# Patient Record
Sex: Female | Born: 1965 | Race: Black or African American | Hispanic: No | Marital: Single | Smoking: Current every day smoker
Health system: Southern US, Community
[De-identification: ages and names within clinical notes are randomized; demographics above are authoritative.]

## PROBLEM LIST (undated history)

## (undated) DIAGNOSIS — J45909 Unspecified asthma, uncomplicated: Secondary | ICD-10-CM

## (undated) DIAGNOSIS — Z9109 Other allergy status, other than to drugs and biological substances: Secondary | ICD-10-CM

## (undated) DIAGNOSIS — K219 Gastro-esophageal reflux disease without esophagitis: Secondary | ICD-10-CM

## (undated) HISTORY — DX: Gastro-esophageal reflux disease without esophagitis: K21.9

---

## 2016-10-16 ENCOUNTER — Emergency Department (HOSPITAL_BASED_OUTPATIENT_CLINIC_OR_DEPARTMENT_OTHER)
Admission: EM | Admit: 2016-10-16 | Discharge: 2016-10-16 | Disposition: A | Discharge status: Home / Self Care | Payer: Commercial Managed Care - PPO | Attending: Emergency Medicine | Admitting: Emergency Medicine

## 2016-10-16 ENCOUNTER — Encounter (HOSPITAL_BASED_OUTPATIENT_CLINIC_OR_DEPARTMENT_OTHER): Payer: Self-pay | Admitting: *Deleted

## 2016-10-16 DIAGNOSIS — J45909 Unspecified asthma, uncomplicated: Secondary | ICD-10-CM | POA: Insufficient documentation

## 2016-10-16 DIAGNOSIS — L509 Urticaria, unspecified: Secondary | ICD-10-CM | POA: Diagnosis not present

## 2016-10-16 DIAGNOSIS — F17200 Nicotine dependence, unspecified, uncomplicated: Secondary | ICD-10-CM | POA: Diagnosis not present

## 2016-10-16 DIAGNOSIS — L299 Pruritus, unspecified: Secondary | ICD-10-CM | POA: Diagnosis present

## 2016-10-16 HISTORY — DX: Other allergy status, other than to drugs and biological substances: Z91.09

## 2016-10-16 MED ORDER — DIPHENHYDRAMINE HCL 25 MG PO CAPS
25.0000 mg | ORAL_CAPSULE | Freq: Once | ORAL | Status: AC
  Administered 2016-10-16: 25 mg via ORAL
  Filled 2016-10-16: qty 1

## 2016-10-16 MED ORDER — FAMOTIDINE 20 MG PO TABS
20.0000 mg | ORAL_TABLET | Freq: Once | ORAL | Status: AC
  Administered 2016-10-16: 20 mg via ORAL
  Filled 2016-10-16: qty 1

## 2016-10-16 MED ORDER — PREDNISONE 10 MG PO TABS
60.0000 mg | ORAL_TABLET | Freq: Once | ORAL | Status: AC
  Administered 2016-10-16: 60 mg via ORAL
  Filled 2016-10-16: qty 1

## 2016-10-16 MED ORDER — IPRATROPIUM BROMIDE 0.02 % IN SOLN
0.5000 mg | Freq: Once | RESPIRATORY_TRACT | Status: AC
  Administered 2016-10-16: 0.5 mg via RESPIRATORY_TRACT
  Filled 2016-10-16: qty 2.5

## 2016-10-16 MED ORDER — PREDNISONE 20 MG PO TABS: 40.0000 mg | ORAL_TABLET | Freq: Every day | ORAL | 0 refills | Status: DC

## 2016-10-16 MED ORDER — ALBUTEROL SULFATE (2.5 MG/3ML) 0.083% IN NEBU
5.0000 mg | INHALATION_SOLUTION | Freq: Once | RESPIRATORY_TRACT | Status: AC
  Administered 2016-10-16: 5 mg via RESPIRATORY_TRACT
  Filled 2016-10-16: qty 6

## 2016-10-16 MED FILL — predniSONE 20 MG TABS: 20 | 4 days supply | Qty: 8 | Fill #0

## 2016-10-16 NOTE — ED Triage Notes (Signed)
Pt reports rash "hives" and itching since Sunday, states she has allergic reaction several times yearly, states she sees an allergy doctor at times, but has not tried to get an appointment for this episode. Pt states had increased itching with mouth soreness this morning , so she came here for help.

## 2016-10-16 NOTE — Discharge Instructions (Signed)
Please continue taking over-the-counter Benadryl for itchiness. He may also continue taking your Zyrtec. Please follow-up with a primary care doctor and allergist if symptoms continue. Return to the ED if he developed any worsening symptoms or difficulty breathing or for any other reason. If you feel like you're having difficulty breathing or your throat is closing up please use her EpiPen.

## 2016-10-16 NOTE — ED Provider Notes (Signed)
[facility] Provider [facility]   CSN: [id number] Arrival date & time: 10/16/16  0841     History   Chief Complaint Chief Complaint  Patient presents with  . Allergic Reaction    HPI Heidi Washington is a 50 y.o. female.  50 year old African-American female past medical history significant for allergies presents to the ED today with allergic reaction. Patient states that she gets these reactions approximately 2-3 times per year. States that she has several allergies and the change in seasons can cause a reaction. She does not have a specific trigger. She is followed by an allergist but was able to make an appointment which is why she presented to the ER today. Patient states that she noticed hives that started yesterday and spread to her entire body. She endorses intense pruritus. She has not tried anything at home for the itching. Nothing makes better or worse. Patient states that she does have asthma and bronchitis and feels like she is wheezing slightly. Patient states that she does take Allegra daily for allergies. She states her allergies this year have been worse. She denies any shortness of breath or difficulty breathing. Denies feeling like her throat is closing up or facial/oral swelling. Denies any fever, chills, headache, vision changes, chest pain, shortness of breath, lightheadedness, dizziness, nausea, emesis, abdominal pain, urinary symptoms, change in bowel habits, numbness/tingling.      Past Medical History:  Diagnosis Date  . Environmental allergies     There are no active problems to display for this patient.   History reviewed. No pertinent surgical history.  OB History    No data available       Home Medications    Prior to Admission medications   Not on File    Family History History reviewed. No pertinent family history.  Social History Social History  Substance Use Topics  . Smoking status: Current Every Day Smoker  . Smokeless tobacco:  Never Used  . Alcohol use Not on file     Allergies   Review of patient's allergies indicates no known allergies.   Review of Systems Review of Systems  Constitutional: Negative for chills and fever.  HENT: Positive for congestion and sneezing. Negative for mouth sores.   Eyes: Positive for itching. Negative for visual disturbance.  Respiratory: Positive for wheezing. Negative for cough and shortness of breath.   Cardiovascular: Negative for chest pain, palpitations and leg swelling.  Gastrointestinal: Negative for abdominal pain, diarrhea, nausea and vomiting.  Skin: Positive for rash.  Allergic/Immunologic: Positive for environmental allergies.  Neurological: Negative for dizziness, syncope, weakness, light-headedness and headaches.  All other systems reviewed and are negative.    Physical Exam Updated Vital Signs BP 141/75 (BP Location: Left Arm)   Pulse 105   Temp 98.2 F (36.8 C) (Oral)   Resp 18   Ht 5\' 5"  (1.651 m)   Wt 83.5 kg   LMP 09/05/2016   SpO2 100%   BMI 30.62 kg/m   Physical Exam  Constitutional: She appears well-developed and well-nourished. No distress.  Patient is speaking in complete sentences and able to maintain her airway. She is able to maintain her secretions.  HENT:  Head: Normocephalic and atraumatic.  No angioedema of lips, tongue appreciated. Oropharynx is clear without any edema or erythema. No lesions noted in the oral cavity.  Eyes: Conjunctivae are normal. Pupils are equal, round, and reactive to light. Right eye exhibits no discharge. Left eye exhibits no discharge. No scleral icterus.  Neck:  Normal range of motion. Neck supple.  Cardiovascular: Normal rate, regular rhythm, normal heart sounds and intact distal pulses.   Heart rate was 92 on my exam.  Pulmonary/Chest: Effort normal and breath sounds normal. No respiratory distress. She has no wheezes.  She is speaking in complete sentences and maintaining her airway. Oxygen saturation  are 100% on room air. Lungs are clear to auscultation bilaterally.  Musculoskeletal: Normal range of motion.  Lymphadenopathy:    She has no cervical adenopathy.  Neurological: She is alert.  Skin: Skin is warm and dry. Rash noted. No pallor.  Urticarial hives diffusely to upper chest, back, bilateral arms. Slight erythema to the face. No edema noted.  Nursing note and vitals reviewed.    ED Treatments / Results  Labs (all labs ordered are listed, but only abnormal results are displayed) Labs Reviewed - No data to display  EKG  EKG Interpretation None       Radiology No results found.  Procedures Procedures (including critical care time)  Medications Ordered in ED Medications  predniSONE (DELTASONE) tablet 60 mg (not administered)  famotidine (PEPCID) tablet 20 mg (not administered)  diphenhydrAMINE (BENADRYL) capsule 25 mg (not administered)  albuterol (PROVENTIL) (2.5 MG/3ML) 0.083% nebulizer solution 5 mg (not administered)  ipratropium (ATROVENT) nebulizer solution 0.5 mg (not administered)     Initial Impression / Assessment and Plan / ED Course  I have reviewed the triage vital signs and the nursing notes.  Pertinent labs & imaging results that were available during my care of the patient were reviewed by me and considered in my medical decision making (see chart for details).  Clinical Course  Patient re-evaluated prior to dc, is hemodynamically stable, in no respiratory distress, and denies the feeling of throat closing. Pt has been advised to take OTC benadryl and prednisone for 4 days & return to the ED if they have a mod-severe allergic rxn (s/s including throat closing, difficulty breathing, swelling of lips face or tongue). Pt is to follow up with their PCP. Pt is agreeable with plan & verbalizes understanding. She has been encouraged to continue using her zyrtec. She feels like she is safe for discharge. Discharge home in NAD with stable vs with strict  return precautions.   Final Clinical Impressions(s) / ED Diagnoses   Final diagnoses:  Urticaria    New Prescriptions New Prescriptions   PREDNISONE (DELTASONE) 20 MG TABLET    Take 2 tablets (40 mg total) by mouth daily with breakfast.     Rise MuKenneth T Mishell Donalson, PA-C 10/16/16 1019    Alvira MondayErin Schlossman, MD 10/17/16 2335

## 2016-10-25 ENCOUNTER — Emergency Department (HOSPITAL_BASED_OUTPATIENT_CLINIC_OR_DEPARTMENT_OTHER): Payer: Commercial Managed Care - PPO

## 2016-10-25 ENCOUNTER — Emergency Department (HOSPITAL_BASED_OUTPATIENT_CLINIC_OR_DEPARTMENT_OTHER)
Admission: EM | Admit: 2016-10-25 | Discharge: 2016-10-25 | Disposition: A | Discharge status: Home / Self Care | Payer: Commercial Managed Care - PPO | Attending: Emergency Medicine | Admitting: Emergency Medicine

## 2016-10-25 ENCOUNTER — Encounter (HOSPITAL_BASED_OUTPATIENT_CLINIC_OR_DEPARTMENT_OTHER): Payer: Self-pay | Admitting: *Deleted

## 2016-10-25 DIAGNOSIS — L299 Pruritus, unspecified: Secondary | ICD-10-CM

## 2016-10-25 DIAGNOSIS — Z79899 Other long term (current) drug therapy: Secondary | ICD-10-CM | POA: Insufficient documentation

## 2016-10-25 DIAGNOSIS — L509 Urticaria, unspecified: Secondary | ICD-10-CM | POA: Diagnosis not present

## 2016-10-25 DIAGNOSIS — F17210 Nicotine dependence, cigarettes, uncomplicated: Secondary | ICD-10-CM | POA: Diagnosis not present

## 2016-10-25 LAB — CBC WITH DIFFERENTIAL/PLATELET
BASOS PCT: 0 %
Basophils Absolute: 0 10*3/uL (ref 0.0–0.1)
EOS ABS: 0 10*3/uL (ref 0.0–0.7)
EOS PCT: 0 %
HCT: 41.8 % (ref 36.0–46.0)
Hemoglobin: 13.7 g/dL (ref 12.0–15.0)
Lymphocytes Relative: 24 %
Lymphs Abs: 1.5 10*3/uL (ref 0.7–4.0)
MCH: 30 pg (ref 26.0–34.0)
MCHC: 32.8 g/dL (ref 30.0–36.0)
MCV: 91.7 fL (ref 78.0–100.0)
MONO ABS: 0.2 10*3/uL (ref 0.1–1.0)
MONOS PCT: 3 %
NEUTROS PCT: 73 %
Neutro Abs: 4.7 10*3/uL (ref 1.7–7.7)
PLATELETS: 292 10*3/uL (ref 150–400)
RBC: 4.56 MIL/uL (ref 3.87–5.11)
RDW: 13.4 % (ref 11.5–15.5)
WBC: 6.4 10*3/uL (ref 4.0–10.5)

## 2016-10-25 LAB — COMPREHENSIVE METABOLIC PANEL
ALBUMIN: 4.3 g/dL (ref 3.5–5.0)
ALT: 18 U/L (ref 14–54)
ANION GAP: 7 (ref 5–15)
AST: 16 U/L (ref 15–41)
Alkaline Phosphatase: 76 U/L (ref 38–126)
BUN: 10 mg/dL (ref 6–20)
CO2: 25 mmol/L (ref 22–32)
Calcium: 9.8 mg/dL (ref 8.9–10.3)
Chloride: 106 mmol/L (ref 101–111)
Creatinine, Ser: 0.49 mg/dL (ref 0.44–1.00)
GFR calc Af Amer: >60 mL/min (ref >60)
GFR calc non Af Amer: >60 mL/min (ref >60)
GLUCOSE: 76 mg/dL (ref 65–99)
POTASSIUM: 3.6 mmol/L (ref 3.5–5.1)
SODIUM: 138 mmol/L (ref 135–145)
Total Bilirubin: 0.5 mg/dL (ref 0.3–1.2)
Total Protein: 7.4 g/dL (ref 6.5–8.1)

## 2016-10-25 MED ORDER — FAMOTIDINE IN NACL 20-0.9 MG/50ML-% IV SOLN
20.0000 mg | Freq: Once | INTRAVENOUS | Status: AC
  Administered 2016-10-25: 20 mg via INTRAVENOUS
  Filled 2016-10-25: qty 50

## 2016-10-25 MED ORDER — DIPHENHYDRAMINE HCL 50 MG/ML IJ SOLN
50.0000 mg | Freq: Once | INTRAMUSCULAR | Status: AC
  Administered 2016-10-25: 50 mg via INTRAVENOUS
  Filled 2016-10-25: qty 1

## 2016-10-25 MED ORDER — METHYLPREDNISOLONE SODIUM SUCC 125 MG IJ SOLR
125.0000 mg | Freq: Once | INTRAMUSCULAR | Status: AC
  Administered 2016-10-25: 125 mg via INTRAVENOUS
  Filled 2016-10-25: qty 2

## 2016-10-25 MED ORDER — SODIUM CHLORIDE 0.9 % IV BOLUS (SEPSIS)
1000.0000 mL | Freq: Once | INTRAVENOUS | Status: AC
  Administered 2016-10-25: 1000 mL via INTRAVENOUS

## 2016-10-25 MED ORDER — PREDNISONE 50 MG PO TABS: 50.0000 mg | ORAL_TABLET | Freq: Every day | ORAL | 0 refills | Status: AC

## 2016-10-25 MED FILL — predniSONE 50 MG TABS: 50 | 5 days supply | Qty: 5 | Fill #0

## 2016-10-25 NOTE — ED Notes (Signed)
Pt directed to pharmacy to pick up prescriptions-  

## 2016-10-25 NOTE — ED Provider Notes (Signed)
[facility]   CSN: [id number] Arrival date & time: 10/25/16  7829     History   Chief Complaint Chief Complaint  Patient presents with  . Urticaria    HPI Heidi Washington is a 50 y.o. female With a past medical history significant for allergies who presents with rash, pruritus, decrease in appetite, and mild cough. Patient is accompanied by spouse. Patient reports that four years, she has had episodes of allergic reactions. She reports that last week, she had an episode of diffuse rash and pruritus. Patient reports that she is "allergic to everything" and  This is a typical episode of allergic reaction for her. She denies any difficulty breathing, throat swelling, difficulty swallowing, or tongue swelling. She does report mild lip tingling/swelling. She says that she completed steroids several days ago and yesterday her rash return. She denies nausea, vomiting, lightheadedness, chest pain, shortness of breath, constipation, diarrhea, dysuria. She does however report a mild cough.   Rash   This is a recurrent problem. The current episode started yesterday. The problem has not changed since onset.The problem is associated with nothing. There has been no fever. Affected Location: all over body. The pain is at a severity of 0/10. The patient is experiencing no pain. The pain has been constant since onset. Associated symptoms include itching. Pertinent negatives include no blisters, no pain and no weeping. She has tried antihistamines for the symptoms. The treatment provided no relief. Risk factors include a change in medications (patient completed steroids several days ago.).    Past Medical History:  Diagnosis Date  . Environmental allergies     There are no active problems to display for this patient.   History reviewed. No pertinent surgical history.  OB History    No data available       Home Medications    Prior to Admission medications   Medication  Sig Start Date End Date Taking? Authorizing Provider  diphenhydrAMINE (BENADRYL) 25 mg capsule Take 25 mg by mouth every 6 (six) hours as needed.   Yes Historical Provider, MD  famotidine (PEPCID) 10 MG tablet Take 10 mg by mouth daily.   Yes Historical Provider, MD  fexofenadine (ALLEGRA) 180 MG tablet Take 180 mg by mouth daily.   Yes Historical Provider, MD  loratadine (CLARITIN) 10 MG tablet Take 10 mg by mouth daily.   Yes Historical Provider, MD  predniSONE (DELTASONE) 20 MG tablet Take 2 tablets (40 mg total) by mouth daily with breakfast. 10/16/16   Rise Mu, PA-C    Family History No family history on file.  Social History Social History  Substance Use Topics  . Smoking status: Current Every Day Smoker    Types: Cigarettes  . Smokeless tobacco: Never Used  . Alcohol use 1.2 oz/week    2 Cans of beer per week     Comment: 2beers/daily     Allergies   Review of patient's allergies indicates no known allergies.   Review of Systems Review of Systems  Constitutional: Positive for appetite change (Decreased). Negative for activity change, chills, diaphoresis, fatigue and fever.  HENT: Positive for facial swelling (Patient reports mild lip swelling). Negative for congestion, postnasal drip, rhinorrhea, sinus pressure, sore throat, trouble swallowing and voice change.   Eyes: Negative for visual disturbance.  Respiratory: Negative for cough, chest tightness, shortness of breath and stridor.   Cardiovascular: Negative for chest pain, palpitations and leg swelling.  Gastrointestinal: Negative for abdominal distention, abdominal pain, constipation, diarrhea,  nausea and vomiting.  Genitourinary: Negative for difficulty urinating, dysuria, flank pain, frequency, hematuria, menstrual problem, pelvic pain, vaginal bleeding and vaginal discharge.  Musculoskeletal: Negative for back pain and neck pain.  Skin: Positive for itching and rash. Negative for wound.  Neurological:  Negative for dizziness, weakness, light-headedness, numbness and headaches.  Psychiatric/Behavioral: Negative for agitation and confusion.  All other systems reviewed and are negative.    Physical Exam Updated Vital Signs BP 141/90 (BP Location: Right Arm)   Pulse 103   Temp 99.1 F (37.3 C) (Oral)   Resp 18   Ht 5\' 5"  (1.651 m)   Wt 184 lb (83.5 kg)   LMP 09/05/2016   SpO2 100%   BMI 30.62 kg/m   Physical Exam  Constitutional: She is oriented to person, place, and time. She appears well-developed and well-nourished. No distress.  HENT:  Head: Normocephalic and atraumatic.  Mouth/Throat: No oropharyngeal exudate.  Eyes: Conjunctivae and EOM are normal. Pupils are equal, round, and reactive to light.  Neck: Normal range of motion. Neck supple.  Cardiovascular: Normal rate and regular rhythm.   No murmur heard. Pulmonary/Chest: Effort normal and breath sounds normal. No stridor. No respiratory distress. She has no wheezes. She has no rales. She exhibits no tenderness.  Abdominal: Soft. There is no tenderness.  Musculoskeletal: She exhibits no edema.  Neurological: She is alert and oriented to person, place, and time. She has normal reflexes. She displays normal reflexes. No cranial nerve deficit. She exhibits normal muscle tone. Coordination normal.  Skin: Skin is warm and dry. Capillary refill takes less than 2 seconds. Rash noted.  Psychiatric: She has a normal mood and affect.  Nursing note and vitals reviewed.    ED Treatments / Results  Labs (all labs ordered are listed, but only abnormal results are displayed) Labs Reviewed  CBC WITH DIFFERENTIAL/PLATELET  COMPREHENSIVE METABOLIC PANEL    EKG  EKG Interpretation None       Radiology Dg Chest 2 View  Result Date: 10/25/2016 CLINICAL DATA:  Persistent hives. EXAM: CHEST  2 VIEW COMPARISON:  None. FINDINGS: The cardiac silhouette, mediastinal and hilar contours are normal and stable. The lungs are clear. No  pleural effusions. The bony thorax is intact. IMPRESSION: No acute cardiopulmonary findings. Electronically Signed   By: Rudie MeyerP.  Gallerani M.D.   On: 10/25/2016 10:54    Procedures Procedures (including critical care time)  Medications Ordered in ED Medications  methylPREDNISolone sodium succinate (SOLU-MEDROL) 125 mg/2 mL injection 125 mg (125 mg Intravenous Given 10/25/16 1048)  diphenhydrAMINE (BENADRYL) injection 50 mg (50 mg Intravenous Given 10/25/16 1050)  famotidine (PEPCID) IVPB 20 mg premix (0 mg Intravenous Stopped 10/25/16 1120)  sodium chloride 0.9 % bolus 1,000 mL (0 mLs Intravenous Stopped 10/25/16 1224)     Initial Impression / Assessment and Plan / ED Course  I have reviewed the triage vital signs and the nursing notes.  Pertinent labs & imaging results that were available during my care of the patient were reviewed by me and considered in my medical decision making (see chart for details).  Clinical Course  Value Comment By Time  Hemoglobin: 13.7 (Reviewed) Canary Brimhristopher J Tegeler, MD 11/03 2048    Staci RighterMary Paternostro is a 50 y.o. female With a past medical history significant for allergies who presents with rash, pruritus, decrease in appetite, and mild cough.   History and exam are seen above.  Patient exam showed urticaria rash all over body. No posterior oropharyngeal edema or swelling was seen.  No lip swelling or tongue swelling was seen. Normal neck range of motion with no stridor. No wheezing. No abdominal tenderness. Unremarkable neurologic exam. Lungs clear.  Given cough, x-ray order. No evidence of pneumonia.  Patient given Fluids, histamine blockers, and steroids. Patient had resolution in itching. Patient had significant improvement in rash.   Laboratory testing return showing no acute abnormalities. Given patient's resolution in itching, and improvement in rash, patient felt appropriate for discharge. Patient instructed to follow up with PCP in several days to discuss  more long-term allergy management. Patient will be given several days of steroids. Patient will continue outpatient antihistamine management. Do not feel patient needs epinephrine at this time due to lack of airway problems. Patient understood return precautions and was discharged in good condition.     Final Clinical Impressions(s) / ED Diagnoses   Final diagnoses:  Urticaria  Pruritus    New Prescriptions Discharge Medication List as of 10/25/2016  1:13 PM    START taking these medications   Details  !! predniSONE (DELTASONE) 50 MG tablet Take 1 tablet (50 mg total) by mouth daily., Starting Fri 10/25/2016, Until Wed 10/30/2016, Print     !! - Potential duplicate medications found. Please discuss with provider.      Clinical Impression: 1. Urticaria   2. Pruritus     Disposition: Discharge  Condition: Good  I have discussed the results, Dx and Tx plan with the pt(& family if present). He/she/they expressed understanding and agree(s) with the plan. Discharge instructions discussed at great length. Strict return precautions discussed and pt &/or family have verbalized understanding of the instructions. No further questions at time of discharge.    Discharge Medication List as of 10/25/2016  1:13 PM    START taking these medications   Details  !! predniSONE (DELTASONE) 50 MG tablet Take 1 tablet (50 mg total) by mouth daily., Starting Fri 10/25/2016, Until Wed 10/30/2016, Print     !! - Potential duplicate medications found. Please discuss with provider.      Follow Up: Glorianne ManchesterMakeecha Reed-Crawford, MD [address] [place] [address]        Heide Scaleshristopher J Tegeler, MD 10/25/16 [phone]

## 2016-10-25 NOTE — Discharge Instructions (Signed)
Please follow-up with your PCP in the next few days for further management of your allergic reactions. Please take Benadryl and Pepcid which she had at home. Please take the steroids for the next few days. Please return if any symptoms return or worsen.

## 2016-10-25 NOTE — ED Triage Notes (Signed)
Seen here on 10/25 and treated with prednisone for Hives. States finished prednisone Sunday this week and hives returned on Tuesday. States Sx are worsening. Speech clear, no s/s respiratory distress

## 2016-11-11 ENCOUNTER — Ambulatory Visit (INDEPENDENT_AMBULATORY_CARE_PROVIDER_SITE_OTHER): Payer: Commercial Managed Care - PPO | Admitting: Pediatrics

## 2016-11-11 ENCOUNTER — Encounter: Payer: Self-pay | Admitting: Pediatrics

## 2016-11-11 VITALS — BP 130/80 | HR 88 | Temp 98.2°F | Resp 20 | Ht 64.0 in | Wt 185.6 lb

## 2016-11-11 DIAGNOSIS — E049 Nontoxic goiter, unspecified: Secondary | ICD-10-CM | POA: Diagnosis not present

## 2016-11-11 DIAGNOSIS — L508 Other urticaria: Secondary | ICD-10-CM | POA: Diagnosis not present

## 2016-11-11 DIAGNOSIS — R5383 Other fatigue: Secondary | ICD-10-CM

## 2016-11-11 DIAGNOSIS — J4530 Mild persistent asthma, uncomplicated: Secondary | ICD-10-CM

## 2016-11-11 DIAGNOSIS — Z720 Tobacco use: Secondary | ICD-10-CM | POA: Diagnosis not present

## 2016-11-11 DIAGNOSIS — J3089 Other allergic rhinitis: Secondary | ICD-10-CM | POA: Diagnosis not present

## 2016-11-11 DIAGNOSIS — T7800XD Anaphylactic reaction due to unspecified food, subsequent encounter: Secondary | ICD-10-CM | POA: Diagnosis not present

## 2016-11-11 DIAGNOSIS — T7800XA Anaphylactic reaction due to unspecified food, initial encounter: Secondary | ICD-10-CM | POA: Insufficient documentation

## 2016-11-11 MED ORDER — RANITIDINE HCL 150 MG PO TABS: 150.0000 mg | ORAL_TABLET | Freq: Two times a day (BID) | ORAL | 5 refills | Status: AC

## 2016-11-11 MED ORDER — EPINEPHRINE 0.3 MG/0.3ML IJ SOAJ: INTRAMUSCULAR | 2 refills | Status: AC

## 2016-11-11 MED ORDER — ALBUTEROL SULFATE HFA 108 (90 BASE) MCG/ACT IN AERS: 2.0000 | INHALATION_SPRAY | RESPIRATORY_TRACT | 2 refills | Status: AC | PRN

## 2016-11-11 MED ORDER — MONTELUKAST SODIUM 10 MG PO TABS: 10.0000 mg | ORAL_TABLET | Freq: Every day | ORAL | 5 refills | Status: AC

## 2016-11-11 NOTE — Progress Notes (Signed)
[address] [place] [address] Dept: [phone]  New Patient Note  Patient ID: Heidi RighterMary Washington, female    DOB: 1966-10-22  Age: 50 y.o. MRN: [id number] Date of Office Visit: 11/11/2016 Referring provider: Glorianne ManchesterMakeecha Reed-Crawford, MD [address] [place], [place] [address]    Chief Complaint: Urticaria (urgent care twice in one month, hives all over. Just finished prednisone pack. )  HPI Heidi RighterMary Washington presents for evaluation of exacerbation of chronic urticaria. Her exacerbation began about a month ago and she needed 5 days of prednisone starting October 25th. Her asthma has been worse since March of this year. She has been smoking cigarettes since 50 years of age. She has had asthma for several years. She has nasal congestion aggravated by exposure to dust, cigarette smoke and weather changes. She is allergic to fish and shellfish. Her allergic reactions to fish and shellfish were noted in 2014. She has had urticaria since 2009. She has a history of a goiter with normal thyroid testing the past. She is on fexofenadine 180 milligrams at night and  loratadine 10 mg at night and Pepcid 10 mg once a day  Review of Systems  Constitutional: Negative.   HENT:       Nasal congestion for several years  Eyes: Negative.   Respiratory:       Asthma for several years. Current cigarette smoker  Cardiovascular: Negative.   Gastrointestinal:       Heartburn and stomach acid worse over the past month  Genitourinary: Negative.   Musculoskeletal: Negative.   Skin:       Hives since 2009  Neurological: Negative.   Endo/Heme/Allergies:       No diabetes. Goiter in the past with normal thyroid studies  Psychiatric/Behavioral: Negative.     Outpatient Encounter Prescriptions as of 11/11/2016  Medication Sig  . diphenhydrAMINE (BENADRYL) 25 mg capsule Take 25 mg by mouth every 6 (six) hours as needed.  . famotidine (PEPCID) 10 MG tablet Take 10 mg by mouth daily.  . fexofenadine (ALLEGRA)  180 MG tablet Take 180 mg by mouth daily.  Marland Kitchen. loratadine (CLARITIN) 10 MG tablet Take 10 mg by mouth daily.  . predniSONE (DELTASONE) 20 MG tablet Take 2 tablets (40 mg total) by mouth daily with breakfast. (Patient not taking: Reported on 11/11/2016)   No facility-administered encounter medications on file as of 11/11/2016.      Drug Allergies:  No Known Allergies  Family History: Heidi Washington's family history includes Allergic rhinitis in her brother, daughter, mother, and sister; Asthma in her brother and mother; Bronchitis in her mother; COPD in her mother; Urticaria in her son..  Social and environmental. 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 There are no pets in the home. She has been smoking cigarettes for 23 years averaging 10-15 cigarettes per day  Physical Exam: BP 130/80   Pulse 88   Temp 98.2 F (36.8 C) (Oral)   Resp 20   Ht 5\' 4"  (1.626 m)   Wt 185 lb 9.6 oz (84.2 kg)   LMP 09/05/2016   BMI 31.86 kg/m    Physical Exam  Constitutional: She is oriented to person, place, and time. She appears well-developed and well-nourished.  HENT:  Eyes normal. Ears normal. Nose moderate swelling of nasal turbinates. Pharynx normal.  Neck: Neck supple. Thyromegaly (her thyroid was symmetrically enlarged and soft) present.  Cardiovascular:  S1 and S2 normal no murmurs  Pulmonary/Chest:  Clear to percussionand auscultation  Abdominal: Soft. There is no  tenderness (no hepatosplenomegaly).  Lymphadenopathy:    She has no cervical adenopathy.  Neurological: She is alert and oriented to person, place, and time.  Skin:  Clear except for several hives present. There was a suggestion of dermographia  Psychiatric: She has a normal mood and affect. Her behavior is normal. Judgment and thought content normal.  Vitals reviewed.   Diagnostics: FVC 1.99 L FEV1 1.74 L. Predicted FVC 2.90 L predicted FEV1 2.32 L. After albuterol 2 puffs FVC 1.98 L FEV1 1.70 L-this shows a mild reduction in  the forced vital  capacity and FEV1 was no significant improvement after albuterol  Allergy skin tests were positive to grass pollen, dust mites, cockroach. Slight reactivity noted to weeds, cat and  dog. She had very positive skin tests to a shellfish and fish   Assessment  Assessment and Plan: 1. Chronic urticaria   2. Fatigue, unspecified type   3. Mild persistent asthma without complication   4. 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 Goiter   7. Tobacco use     No orders of the defined types were placed in this encounter.   Patient Instructions  Allegra 180 mg in the morning and Zyrtec 10 mg at night for itching Ranitidine 150 mg twice a day-it may help hives Montelukast  10 mg once a day for coughing or wheezing-it may help hives Ventolin 2 puffs every 4 hours if needed for wheezing or coughing spells Add prednisone 10 mg twice a day for 4 days, 10 mg on the fifth day to bring your allergic symptoms under control Do foods with salicylates make you itch? Stop smoking cigarettes  Avoid fish and shellfish. If you have an allergic reaction take Benadryl 50 mg every 4 hours and if you have life-threatening symptoms inject with EpiPen 0.3 mg  I will call you with the results of your  lab work   Return in about 4 weeks (around 12/09/2016).   Thank you for the opportunity to care for this patient.  Please do not hesitate to contact me with questions.  Tonette BihariJ. A. Ewan Grau, M.D.  [facility] [address] [place], [place] [address] [phone]

## 2016-11-11 NOTE — Patient Instructions (Addendum)
Allegra 180 mg in the morning and Zyrtec 10 mg at night for itching Ranitidine 150 mg twice a day-it may help hives Montelukast  10 mg once a day for coughing or wheezing-it may help hives Ventolin 2 puffs every 4 hours if needed for wheezing or coughing spells Add prednisone 10 mg twice a day for 4 days, 10 mg on the fifth day to bring your allergic symptoms under control Do foods with salicylates make you itch? Stop smoking cigarettes  Avoid fish and shellfish. If you have an allergic reaction take Benadryl 50 mg every 4 hours and if you have life-threatening symptoms inject with EpiPen 0.3 mg  I will call you with the results of your  lab work

## 2016-11-22 LAB — CBC WITH DIFFERENTIAL/PLATELET
BASOS ABS: 0 10*3/uL (ref 0.0–0.2)
Basos: 0 %
EOS (ABSOLUTE): 0 10*3/uL (ref 0.0–0.4)
Eos: 1 %
Hematocrit: 37.8 % (ref 34.0–46.6)
Hemoglobin: 11.9 g/dL (ref 11.1–15.9)
Immature Grans (Abs): 0 10*3/uL (ref 0.0–0.1)
Immature Granulocytes: 0 %
Lymphocytes Absolute: 1.7 10*3/uL (ref 0.7–3.1)
Lymphs: 32 %
MCH: 29.2 pg (ref 26.6–33.0)
MCHC: 31.5 g/dL (ref 31.5–35.7)
MCV: 93 fL (ref 79–97)
Monocytes Absolute: 0.2 10*3/uL (ref 0.1–0.9)
Monocytes: 3 %
NEUTROS ABS: 3.4 10*3/uL (ref 1.4–7.0)
Neutrophils: 64 %
PLATELETS: 300 10*3/uL (ref 150–379)
RBC: 4.07 x10E6/uL (ref 3.77–5.28)
RDW: 13.7 % (ref 12.3–15.4)
WBC: 5.2 10*3/uL (ref 3.4–10.8)

## 2016-11-22 LAB — COMPREHENSIVE METABOLIC PANEL
ALK PHOS: 87 IU/L (ref 39–117)
ALT: 17 IU/L (ref 0–32)
AST: 14 IU/L (ref 0–40)
Albumin/Globulin Ratio: 1.6 (ref 1.2–2.2)
Albumin: 4.3 g/dL (ref 3.5–5.5)
BILIRUBIN TOTAL: 0.3 mg/dL (ref 0.0–1.2)
BUN/Creatinine Ratio: 18 (ref 9–23)
BUN: 11 mg/dL (ref 6–24)
CHLORIDE: 103 mmol/L (ref 96–106)
CO2: 23 mmol/L (ref 18–29)
Calcium: 9.9 mg/dL (ref 8.7–10.2)
Creatinine, Ser: 0.6 mg/dL (ref 0.57–1.00)
GFR calc Af Amer: 122 mL/min/{1.73_m2} (ref >59)
GFR calc non Af Amer: 106 mL/min/{1.73_m2} (ref >59)
GLUCOSE: 106 mg/dL — AB (ref 65–99)
Globulin, Total: 2.7 g/dL (ref 1.5–4.5)
Potassium: 3.9 mmol/L (ref 3.5–5.2)
Sodium: 143 mmol/L (ref 134–144)
Total Protein: 7 g/dL (ref 6.0–8.5)

## 2016-11-22 LAB — TSH+FREE T4
FREE T4: 0.93 ng/dL (ref 0.82–1.77)
TSH: 1.64 u[IU]/mL (ref 0.450–4.500)

## 2016-11-22 LAB — SEDIMENTATION RATE: SED RATE: 25 mm/h (ref 0–40)

## 2016-11-22 LAB — ANA W/REFLEX IF POSITIVE: ANA: NEGATIVE

## 2016-11-22 LAB — TRYPTASE: Tryptase: 3 ug/L (ref 2.2–13.2)

## 2016-11-22 LAB — CHRONIC URTICARIA

## 2016-11-26 ENCOUNTER — Telehealth: Payer: Self-pay | Admitting: Allergy

## 2016-11-26 NOTE — Telephone Encounter (Signed)
Patient called said she was seen on 11-11-16 for hives. Was put on prednisone. Didn't start until  Monday nov 27th. Finished on Dec.1.  On Sat. And Sunday started  breaking out again all over body getting on face lips and eyes. Please advise. Work # [phone] until three o'clock after than cell [phone].

## 2016-11-26 NOTE — Telephone Encounter (Signed)
Informed patient of Dr. Beaulah DinningBardelas note. Patient will pick up information tomorrow. And schedule appointment for xolair.

## 2016-11-26 NOTE — Telephone Encounter (Signed)
Continue on Allegra 180 mg in the morning and Zyrtec 10 mg at night, ranitidine 150 mg twice a day, montelukast a10 mg once a day Did  foods with salicylates make her itch. Has she been given information on Xolair ? If not let's go ahead and give her information. See if we have a free sample of Xolair to administer

## 2016-11-27 ENCOUNTER — Ambulatory Visit (INDEPENDENT_AMBULATORY_CARE_PROVIDER_SITE_OTHER): Payer: Commercial Managed Care - PPO | Admitting: *Deleted

## 2016-11-27 DIAGNOSIS — L500 Allergic urticaria: Secondary | ICD-10-CM

## 2016-11-27 NOTE — Progress Notes (Signed)
Immunotherapy   Patient Details  Name: Staci RighterMary Harwick MRN: [id number] Date of Birth: March 29, 1966  11/27/2016  Staci RighterMary Helf started injections for Xolair 300 mg for urticaria  Following schedule: Every 4 weeks Epi-Pen:Epi-Pen Available  Consent signed and patient instructions given. Sample of Xolair 300 mg given today per Dr. Beaulah DinningBardelas. Patient waited 1 hour with no problems.    Maurine SimmeringLogan D Adamae Ricklefs 11/27/2016, 4:34 PM

## 2016-11-28 DIAGNOSIS — L500 Allergic urticaria: Secondary | ICD-10-CM

## 2016-11-28 MED ORDER — OMALIZUMAB 150 MG ~~LOC~~ SOLR
300.0000 mg | SUBCUTANEOUS | Status: AC
  Administered 2016-11-28 – 2017-02-24 (×4): 300 mg via SUBCUTANEOUS

## 2016-12-30 ENCOUNTER — Encounter: Payer: Self-pay | Admitting: Pediatrics

## 2016-12-30 ENCOUNTER — Ambulatory Visit (INDEPENDENT_AMBULATORY_CARE_PROVIDER_SITE_OTHER): Payer: Commercial Managed Care - PPO | Admitting: Pediatrics

## 2016-12-30 VITALS — BP 122/78 | HR 88 | Temp 99.4°F | Resp 16

## 2016-12-30 DIAGNOSIS — J4530 Mild persistent asthma, uncomplicated: Secondary | ICD-10-CM | POA: Diagnosis not present

## 2016-12-30 DIAGNOSIS — Z720 Tobacco use: Secondary | ICD-10-CM | POA: Diagnosis not present

## 2016-12-30 DIAGNOSIS — L508 Other urticaria: Secondary | ICD-10-CM

## 2016-12-30 DIAGNOSIS — L500 Allergic urticaria: Secondary | ICD-10-CM

## 2016-12-30 NOTE — Patient Instructions (Addendum)
Continue on her current medications Add Zithromax 250 mg-take 2 tablets tonight and then one tablet once at night for the next 4 nights see if we can clear up the discolored mucus in her chest and improve her asthma Add prednisone 10 mg twice a day for 4 days, 10 mg on the fifth day to bring her allergic symptoms under control Stop smoking cigarettes Call me if you are not doing well on this treatment plan Continue on Xolair for chronic urticaria

## 2016-12-30 NOTE — Progress Notes (Signed)
  [address] [place] [address] Dept: [phone]  FOLLOW UP NOTE  Patient ID: Heidi Washington, female    DOB: Sep 25, 1966  Age: 51 y.o. MRN: [id number] Date of Office Visit: 12/30/2016  Assessment  Chief Complaint: Urticaria and Asthma  HPI Heidi Washington presents for follow-up of chronic urticaria and asthma. She did improve with her initial injection of Xolair with regard to her chronic urticaria. Over the past month she has noticed some coughing and mild wheezing. She is bringing up a discolored mucus from her chest. She continues to smoke cigarettes. Foods with salicylates did not make her itch. She continues to avoid fish and shellfish. If she has an allergic reaction , she takes Benadryl 50 mg every 4 hours and  if she has life and symptoms she  injects with EpiPen 0.3 mg . As part of her evaluation for chronic urticaria, she had normal thyroid function, and negative chronic urticaria index, negative serum ANCA, normal CBC with differential, normal complete metabolic panel, sedimentation rate of 25 mm/h and a negative tryptase  Current medications are Allegra 180 mg in the morning and Zyrtec 10 mg at night, ranitidine 150 mg twice a day, montelukast asked 10 mg once a day, Ventolin 2 puffs every 4 hours if needed for wheezing or coughing spells.     Drug Allergies:  No Known Allergies  Physical Exam: BP 122/78   Pulse 88   Temp 99.4 F (37.4 C) (Oral)   Resp 16   SpO2 95%    Physical Exam  Constitutional: She is oriented to person, place, and time. She appears well-developed and well-nourished.  HENT:  Eyes normal. Ears normal. Nose normal. Pharynx normal.  Neck: Neck supple.  Cardiovascular:  S1 and S2 normal no murmurs  Pulmonary/Chest:  Clear to percussion auscultation except for minimal wheezing on end expiration  Lymphadenopathy:    She has no cervical adenopathy.  Neurological: She is alert and oriented to person, place, and time.  Skin:  Clear  Psychiatric:  She has a normal mood and affect. Her behavior is normal. Judgment and thought content normal.  Vitals reviewed.   Diagnostics:  FVC 1.91 L FEV1 1.71 L. Predicted FVC 2.90 L predicted FEV1 2.32 L-this shows a mild reduction in the forced vital capacity but stable for her  Assessment and Plan: 1. Allergic urticaria   2. Chronic urticaria   3. Mild persistent asthma without complication   4. Tobacco use     No orders of the defined types were placed in this encounter.   Patient Instructions  Continue on her current medications Add Zithromax 250 mg-take 2 tablets tonight and then one tablet once at night for the next 4 nights see if we can clear up the discolored mucus in her chest and improve her asthma Add prednisone 10 mg twice a day for 4 days, 10 mg on the fifth day to bring her allergic symptoms under control Stop smoking cigarettes Call me if you are not doing well on this treatment plan   Return in about 3 months (around 03/30/2017).    Thank you for the opportunity to care for this patient.  Please do not hesitate to contact me with questions.  Tonette BihariJ. A. Angalina Ante, M.D.  [facility] [address] [place], [place] [address] [phone]

## 2016-12-31 MED ORDER — AZITHROMYCIN 250 MG PO TABS: ORAL_TABLET | ORAL | 0 refills | Status: AC

## 2017-01-27 ENCOUNTER — Ambulatory Visit (INDEPENDENT_AMBULATORY_CARE_PROVIDER_SITE_OTHER): Payer: Commercial Managed Care - PPO

## 2017-01-27 DIAGNOSIS — L500 Allergic urticaria: Secondary | ICD-10-CM

## 2017-01-27 DIAGNOSIS — L508 Other urticaria: Secondary | ICD-10-CM

## 2017-02-11 ENCOUNTER — Ambulatory Visit: Payer: Self-pay | Admitting: *Deleted

## 2017-02-24 ENCOUNTER — Ambulatory Visit (INDEPENDENT_AMBULATORY_CARE_PROVIDER_SITE_OTHER): Payer: Commercial Managed Care - PPO

## 2017-02-24 DIAGNOSIS — L500 Allergic urticaria: Secondary | ICD-10-CM | POA: Diagnosis not present

## 2017-03-24 ENCOUNTER — Ambulatory Visit: Payer: Self-pay

## 2018-01-30 ENCOUNTER — Encounter (HOSPITAL_COMMUNITY): Payer: Self-pay

## 2018-01-30 ENCOUNTER — Emergency Department (HOSPITAL_COMMUNITY): Payer: No Typology Code available for payment source

## 2018-01-30 ENCOUNTER — Ambulatory Visit (HOSPITAL_COMMUNITY)
Admission: EM | Admit: 2018-01-30 | Discharge: 2018-01-31 | Disposition: A | Discharge status: Home / Self Care | Payer: No Typology Code available for payment source | Attending: Orthopedic Surgery | Admitting: Orthopedic Surgery

## 2018-01-30 DIAGNOSIS — S72401B Unspecified fracture of lower end of right femur, initial encounter for open fracture type I or II: Secondary | ICD-10-CM | POA: Insufficient documentation

## 2018-01-30 DIAGNOSIS — W3400XA Accidental discharge from unspecified firearms or gun, initial encounter: Secondary | ICD-10-CM

## 2018-01-30 DIAGNOSIS — K219 Gastro-esophageal reflux disease without esophagitis: Secondary | ICD-10-CM | POA: Insufficient documentation

## 2018-01-30 DIAGNOSIS — S72491B Other fracture of lower end of right femur, initial encounter for open fracture type I or II: Secondary | ICD-10-CM | POA: Diagnosis present

## 2018-01-30 DIAGNOSIS — S81031A Puncture wound without foreign body, right knee, initial encounter: Secondary | ICD-10-CM

## 2018-01-30 HISTORY — DX: Gastro-esophageal reflux disease without esophagitis: K21.9

## 2018-01-30 MED ORDER — TETANUS-DIPHTH-ACELL PERTUSSIS 5-2.5-18.5 LF-MCG/0.5 IM SUSP
0.5000 mL | Freq: Once | INTRAMUSCULAR | Status: AC
  Administered 2018-01-31: 0.5 mL via INTRAMUSCULAR
  Filled 2018-01-30: qty 0.5
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
## 2018-01-30 NOTE — ED Notes (Signed)
Patient transported to CT 
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
## 2018-01-31 ENCOUNTER — Encounter (HOSPITAL_COMMUNITY)
Admission: EM | Disposition: A | Discharge status: Home / Self Care | Payer: Self-pay | Source: Home / Self Care | Attending: Emergency Medicine

## 2018-01-31 ENCOUNTER — Emergency Department (HOSPITAL_COMMUNITY): Payer: No Typology Code available for payment source | Admitting: Certified Registered"

## 2018-01-31 ENCOUNTER — Emergency Department (HOSPITAL_COMMUNITY): Payer: No Typology Code available for payment source

## 2018-01-31 DIAGNOSIS — W3400XA Accidental discharge from unspecified firearms or gun, initial encounter: Secondary | ICD-10-CM

## 2018-01-31 DIAGNOSIS — S81031A Puncture wound without foreign body, right knee, initial encounter: Secondary | ICD-10-CM

## 2018-01-31 DIAGNOSIS — S72491B Other fracture of lower end of right femur, initial encounter for open fracture type I or II: Secondary | ICD-10-CM | POA: Diagnosis present

## 2018-01-31 HISTORY — PX: KNEE ARTHROSCOPY: SHX127

## 2018-01-31 LAB — CBC WITH DIFFERENTIAL/PLATELET
BASOS PCT: 0 %
Basophils Absolute: 0 10*3/uL (ref 0.0–0.1)
EOS ABS: 0.2 10*3/uL (ref 0.0–0.7)
Eosinophils Relative: 3 %
HCT: 41.6 % (ref 36.0–46.0)
Hemoglobin: 13.1 g/dL (ref 12.0–15.0)
Lymphocytes Relative: 43 %
Lymphs Abs: 2.5 10*3/uL (ref 0.7–4.0)
MCH: 30.1 pg (ref 26.0–34.0)
MCHC: 31.5 g/dL (ref 30.0–36.0)
MCV: 95.6 fL (ref 78.0–100.0)
MONO ABS: 0.3 10*3/uL (ref 0.1–1.0)
MONOS PCT: 5 %
Neutro Abs: 2.8 10*3/uL (ref 1.7–7.7)
Neutrophils Relative %: 49 %
Platelets: 229 10*3/uL (ref 150–400)
RBC: 4.35 MIL/uL (ref 3.87–5.11)
RDW: 13.7 % (ref 11.5–15.5)
WBC: 5.8 10*3/uL (ref 4.0–10.5)

## 2018-01-31 LAB — PROTIME-INR
INR: 1.01
PROTHROMBIN TIME: 13.2 s (ref 11.4–15.2)

## 2018-01-31 LAB — BASIC METABOLIC PANEL
Anion gap: 17 — ABNORMAL HIGH (ref 5–15)
BUN: 8 mg/dL (ref 6–20)
CALCIUM: 9.6 mg/dL (ref 8.9–10.3)
CO2: 18 mmol/L — AB (ref 22–32)
CREATININE: 0.51 mg/dL (ref 0.44–1.00)
Chloride: 104 mmol/L (ref 101–111)
Glucose, Bld: 83 mg/dL (ref 65–99)
Potassium: 3.5 mmol/L (ref 3.5–5.1)
SODIUM: 139 mmol/L (ref 135–145)

## 2018-01-31 SURGERY — ARTHROSCOPY, KNEE
Anesthesia: General | Laterality: Right

## 2018-01-31 MED ORDER — OXYCODONE HCL 5 MG PO TABS: 5.0000 mg | ORAL_TABLET | ORAL | 0 refills | Status: AC | PRN

## 2018-01-31 MED ORDER — HYDROMORPHONE HCL 1 MG/ML IJ SOLN
0.2500 mg | INTRAMUSCULAR | Status: DC | PRN
  Administered 2018-01-31 (×2): 0.5 mg via INTRAVENOUS

## 2018-01-31 MED ORDER — CEPHALEXIN 250 MG PO CAPS
500.0000 mg | ORAL_CAPSULE | Freq: Once | ORAL | Status: AC
  Administered 2018-01-31: 500 mg via ORAL
  Filled 2018-01-31: qty 2

## 2018-01-31 MED ORDER — MIDAZOLAM HCL 5 MG/5ML IJ SOLN
INTRAMUSCULAR | Status: DC | PRN
  Administered 2018-01-31: 2 mg via INTRAVENOUS

## 2018-01-31 MED ORDER — LIDOCAINE 2% (20 MG/ML) 5 ML SYRINGE
INTRAMUSCULAR | Status: AC
  Filled 2018-01-31: qty 5

## 2018-01-31 MED ORDER — DEXAMETHASONE SODIUM PHOSPHATE 10 MG/ML IJ SOLN
INTRAMUSCULAR | Status: DC | PRN
  Administered 2018-01-31: 10 mg via INTRAVENOUS

## 2018-01-31 MED ORDER — PROPOFOL 10 MG/ML IV BOLUS
INTRAVENOUS | Status: AC
  Filled 2018-01-31: qty 20

## 2018-01-31 MED ORDER — CEPHALEXIN 500 MG PO CAPS: 500.0000 mg | ORAL_CAPSULE | Freq: Three times a day (TID) | ORAL | 0 refills | Status: AC

## 2018-01-31 MED ORDER — HYDROMORPHONE HCL 1 MG/ML IJ SOLN
INTRAMUSCULAR | Status: AC
  Filled 2018-01-31: qty 1

## 2018-01-31 MED ORDER — BUPIVACAINE HCL (PF) 0.25 % IJ SOLN
INTRAMUSCULAR | Status: AC
  Filled 2018-01-31: qty 30

## 2018-01-31 MED ORDER — PHENYLEPHRINE HCL 10 MG/ML IJ SOLN
INTRAMUSCULAR | Status: DC | PRN
  Administered 2018-01-31 (×3): 80 ug via INTRAVENOUS

## 2018-01-31 MED ORDER — FENTANYL CITRATE (PF) 100 MCG/2ML IJ SOLN
INTRAMUSCULAR | Status: AC
  Administered 2018-01-31: 50 ug
  Filled 2018-01-31: qty 2

## 2018-01-31 MED ORDER — DEXAMETHASONE SODIUM PHOSPHATE 10 MG/ML IJ SOLN
INTRAMUSCULAR | Status: AC
  Filled 2018-01-31: qty 1

## 2018-01-31 MED ORDER — FENTANYL CITRATE (PF) 250 MCG/5ML IJ SOLN
INTRAMUSCULAR | Status: AC
  Filled 2018-01-31: qty 5

## 2018-01-31 MED ORDER — SUCCINYLCHOLINE CHLORIDE 20 MG/ML IJ SOLN
INTRAMUSCULAR | Status: DC | PRN
  Administered 2018-01-31: 120 mg via INTRAVENOUS

## 2018-01-31 MED ORDER — ONDANSETRON 4 MG PO TBDP: 4.0000 mg | ORAL_TABLET | Freq: Three times a day (TID) | ORAL | 0 refills | Status: DC | PRN

## 2018-01-31 MED ORDER — MIDAZOLAM HCL 2 MG/2ML IJ SOLN
INTRAMUSCULAR | Status: AC
  Filled 2018-01-31: qty 2

## 2018-01-31 MED ORDER — SUCCINYLCHOLINE CHLORIDE 200 MG/10ML IV SOSY
PREFILLED_SYRINGE | INTRAVENOUS | Status: AC
  Filled 2018-01-31: qty 10

## 2018-01-31 MED ORDER — LACTATED RINGERS IV SOLN
INTRAVENOUS | Status: DC | PRN
  Administered 2018-01-31 (×2): via INTRAVENOUS

## 2018-01-31 MED ORDER — FENTANYL CITRATE (PF) 100 MCG/2ML IJ SOLN
INTRAMUSCULAR | Status: DC | PRN
  Administered 2018-01-31: 100 ug via INTRAVENOUS
  Administered 2018-01-31: 50 ug via INTRAVENOUS

## 2018-01-31 MED ORDER — ONDANSETRON HCL 4 MG/2ML IJ SOLN
INTRAMUSCULAR | Status: DC | PRN
  Administered 2018-01-31: 4 mg via INTRAVENOUS

## 2018-01-31 MED ORDER — PROPOFOL 10 MG/ML IV BOLUS
INTRAVENOUS | Status: DC | PRN
  Administered 2018-01-31: 200 mg via INTRAVENOUS

## 2018-01-31 MED ORDER — CEFAZOLIN SODIUM-DEXTROSE 2-4 GM/100ML-% IV SOLN
2.0000 g | Freq: Once | INTRAVENOUS | Status: AC
  Administered 2018-01-31: 2 g via INTRAVENOUS
  Filled 2018-01-31: qty 100

## 2018-01-31 MED ORDER — ONDANSETRON HCL 4 MG/2ML IJ SOLN
INTRAMUSCULAR | Status: AC
  Filled 2018-01-31: qty 2

## 2018-01-31 MED ORDER — LIDOCAINE HCL (CARDIAC) 20 MG/ML IV SOLN
INTRAVENOUS | Status: DC | PRN
  Administered 2018-01-31: 100 mg via INTRAVENOUS

## 2018-01-31 SURGICAL SUPPLY — 50 items
BANDAGE ACE 6X5 VEL STRL LF (GAUZE/BANDAGES/DRESSINGS) ×3 IMPLANT
BANDAGE ESMARK 6X9 LF (GAUZE/BANDAGES/DRESSINGS) IMPLANT
BLADE CLIPPER SURG (BLADE) IMPLANT
BLADE CUDA 5.5 (BLADE) IMPLANT
BLADE CUTTER GATOR 3.5 (BLADE) IMPLANT
BLADE GREAT WHITE 4.2 (BLADE) ×2 IMPLANT
BLADE GREAT WHITE 4.2MM (BLADE) ×1
BLADE SURG 11 STRL SS (BLADE) IMPLANT
BNDG ELASTIC 6X10 VLCR STRL LF (GAUZE/BANDAGES/DRESSINGS) ×3 IMPLANT
BNDG ESMARK 6X9 LF (GAUZE/BANDAGES/DRESSINGS)
BUR OVAL 6.0 (BURR) IMPLANT
COVER SURGICAL LIGHT HANDLE (MISCELLANEOUS) ×3 IMPLANT
CUFF TOURNIQUET SINGLE 34IN LL (TOURNIQUET CUFF) IMPLANT
CUFF TOURNIQUET SINGLE 44IN (TOURNIQUET CUFF) IMPLANT
DRAPE ARTHROSCOPY W/POUCH 114 (DRAPES) ×3 IMPLANT
DRAPE SURG 17X23 STRL (DRAPES) ×6 IMPLANT
DRAPE U-SHAPE 47X51 STRL (DRAPES) ×3 IMPLANT
DRSG PAD ABDOMINAL 8X10 ST (GAUZE/BANDAGES/DRESSINGS) IMPLANT
DURAPREP 26ML APPLICATOR (WOUND CARE) ×3 IMPLANT
ELECT CAUTERY BLADE 6.4 (BLADE) IMPLANT
FACESHIELD WRAPAROUND (MASK) IMPLANT
GAUZE SPONGE 4X4 12PLY STRL (GAUZE/BANDAGES/DRESSINGS) ×3 IMPLANT
GAUZE SPONGE 4X4 12PLY STRL LF (GAUZE/BANDAGES/DRESSINGS) ×3 IMPLANT
GAUZE SPONGE 4X4 16PLY XRAY LF (GAUZE/BANDAGES/DRESSINGS) ×3 IMPLANT
GAUZE XEROFORM 1X8 LF (GAUZE/BANDAGES/DRESSINGS) ×3 IMPLANT
GLOVE BIOGEL PI IND STRL 7.0 (GLOVE) ×1 IMPLANT
GLOVE BIOGEL PI IND STRL 8 (GLOVE) ×1 IMPLANT
GLOVE BIOGEL PI INDICATOR 7.0 (GLOVE) ×2
GLOVE BIOGEL PI INDICATOR 8 (GLOVE) ×2
GLOVE ECLIPSE 7.0 STRL STRAW (GLOVE) IMPLANT
GLOVE SKINSENSE NS SZ7.5 (GLOVE)
GLOVE SKINSENSE STRL SZ7.5 (GLOVE) IMPLANT
GLOVE SURG SS PI 7.0 STRL IVOR (GLOVE) ×3 IMPLANT
GLOVE SURG SS PI 7.5 STRL IVOR (GLOVE) ×3 IMPLANT
GOWN STRL REIN XL XLG (GOWN DISPOSABLE) ×3 IMPLANT
KIT ROOM TURNOVER OR (KITS) ×3 IMPLANT
MANIFOLD NEPTUNE II (INSTRUMENTS) IMPLANT
NS IRRIG 1000ML POUR BTL (IV SOLUTION) IMPLANT
PACK ARTHROSCOPY DSU (CUSTOM PROCEDURE TRAY) ×3 IMPLANT
PAD ABD 8X10 STRL (GAUZE/BANDAGES/DRESSINGS) ×6 IMPLANT
PAD ARMBOARD 7.5X6 YLW CONV (MISCELLANEOUS) ×3 IMPLANT
PADDING CAST COTTON 6X4 STRL (CAST SUPPLIES) ×3 IMPLANT
SET ARTHROSCOPY TUBING (MISCELLANEOUS) ×2
SET ARTHROSCOPY TUBING LN (MISCELLANEOUS) ×1 IMPLANT
SPONGE LAP 4X18 X RAY DECT (DISPOSABLE) ×3 IMPLANT
SUT ETHILON 3 0 PS 1 (SUTURE) ×3 IMPLANT
TOWEL OR 17X24 6PK STRL BLUE (TOWEL DISPOSABLE) ×3 IMPLANT
TOWEL OR 17X26 10 PK STRL BLUE (TOWEL DISPOSABLE) ×3 IMPLANT
WAND HAND CNTRL MULTIVAC 90 (MISCELLANEOUS) IMPLANT
WATER STERILE IRR 1000ML POUR (IV SOLUTION) ×3 IMPLANT
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
## 2018-01-31 NOTE — Progress Notes (Signed)
Unable to enter note at correct time due to pt being discharged home. Family return to hospital to ask about "missing prescriptions" [facility]- pharmacist confirmed that ondansetron was available, but not keflex & oxycodone. Follow up call to Dr Aundria Rudogers to ask about medications, per telephone conversation w/ Dr Aundria Rudogers, " I personally handed the prescriptions to the patient this morning". Dr Aundria Rudogers stated he will [facility] & pt could take tylenol & ibuprofen OTC for pain. Family updated by C. Saabir Blyth, Charity fundraiserN. Family stated they would look for prescriptions at home. C. Shequita Peplinski checked bedside and Shred-it box for scripts prior to talking to Dr Aundria Rudogers. None located
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
## 2018-01-31 NOTE — Anesthesia Procedure Notes (Signed)
Procedure Name: Intubation Date/Time: 01/31/2018 3:18 AM Performed by: Babs Bertin, CRNA Pre-anesthesia Checklist: Patient identified, Emergency Drugs available, Suction available and Patient being monitored Patient Re-evaluated:Patient Re-evaluated prior to induction Oxygen Delivery Method: Circle System Utilized Preoxygenation: Pre-oxygenation with 100% oxygen Induction Type: IV induction and Rapid sequence Laryngoscope Size: Mac and 3 Grade View: Grade I Tube type: Oral Tube size: 7.0 mm Number of attempts: 1 Airway Equipment and Method: Stylet and Oral airway Placement Confirmation: ETT inserted through vocal cords under direct vision,  positive ETCO2 and breath sounds checked- equal and bilateral Secured at: 21 cm Tube secured with: Tape Dental Injury: Teeth and Oropharynx as per pre-operative assessment
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
## 2018-01-31 NOTE — Anesthesia Preprocedure Evaluation (Addendum)
Anesthesia Evaluation  Patient identified by MRN, date of birth, ID band Patient awake    Reviewed: Allergy & Precautions, NPO status , Patient's Chart, lab work & pertinent test results  Airway Mallampati: II  TM Distance: >3 FB     Dental   Pulmonary neg pulmonary ROS,    breath sounds clear to auscultation       Cardiovascular negative cardio ROS   Rhythm:Regular Rate:Normal     Neuro/Psych    GI/Hepatic Neg liver ROS, GERD  ,  Endo/Other  negative endocrine ROS  Renal/GU negative Renal ROS     Musculoskeletal   Abdominal   Peds  Hematology   Anesthesia Other Findings   Reproductive/Obstetrics                             Anesthesia Physical Anesthesia Plan  ASA: I  Anesthesia Plan: General   Post-op Pain Management:    Induction: Intravenous, Rapid sequence and Cricoid pressure planned  PONV Risk Score and Plan: 3 and Treatment may vary due to age or medical condition, Ondansetron, Dexamethasone and Midazolam  Airway Management Planned: Oral ETT  Additional Equipment:   Intra-op Plan:   Post-operative Plan: Extubation in OR  Informed Consent: I have reviewed the patients History and Physical, chart, labs and discussed the procedure including the risks, benefits and alternatives for the proposed anesthesia with the patient or authorized representative who has indicated his/her understanding and acceptance.   Dental advisory given  Plan Discussed with: CRNA and Anesthesiologist  Anesthesia Plan Comments:        Anesthesia Quick Evaluation

## 2018-01-31 NOTE — Transfer of Care (Signed)
Immediate Anesthesia Transfer of Care Note  Patient: Heidi Washington  Procedure(s) Performed: ARTHROSCOPIC IRRIGATION AND DEBRIDEMENT RIGHT KNEE (Right )  Patient Location: PACU  Anesthesia Type:General  Level of Consciousness: awake, alert  and oriented  Airway & Oxygen Therapy: Patient Spontanous Breathing  Post-op Assessment: Report given to RN and Post -op Vital signs reviewed and stable  Post vital signs: Reviewed and stable  Last Vitals:  Vitals:   01/31/18 0215 01/31/18 0230  BP: 112/61 124/74  Pulse:    Resp: 12 19  Temp:    SpO2:      Last Pain:  Vitals:   01/31/18 0245  TempSrc:   PainSc: 7          Complications: No apparent anesthesia complications
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
## 2018-02-01 NOTE — Anesthesia Postprocedure Evaluation (Signed)
Anesthesia Post Note  Patient: Heidi Washington  Procedure(s) Performed: ARTHROSCOPIC IRRIGATION AND DEBRIDEMENT RIGHT KNEE (Right )     Patient location during evaluation: PACU Anesthesia Type: General Pain management: pain level controlled Vital Signs Assessment: post-procedure vital signs reviewed and stable Respiratory status: spontaneous breathing Cardiovascular status: stable Anesthetic complications: no    Last Vitals:  Vitals:   01/31/18 0415 01/31/18 0445  BP: 118/77 116/72  Pulse: 95   Resp: 14   Temp: 36.6 C 36.6 C  SpO2: 94% 98%    Last Pain:  Vitals:   01/31/18 0445  TempSrc:   PainSc: 6                  Giovanni Bath

## 2018-02-02 ENCOUNTER — Encounter: Payer: Self-pay | Admitting: Pediatrics

## 2018-02-02 ENCOUNTER — Encounter (HOSPITAL_COMMUNITY): Payer: Self-pay | Admitting: Orthopedic Surgery

## 2018-07-05 IMAGING — DX DG CHEST 2V
2 series · 2 of 2 positions shown · non-contrast
Comparison: None.

CLINICAL DATA: Persistent hives.

EXAM:
CHEST  2 VIEW

[chest pa]
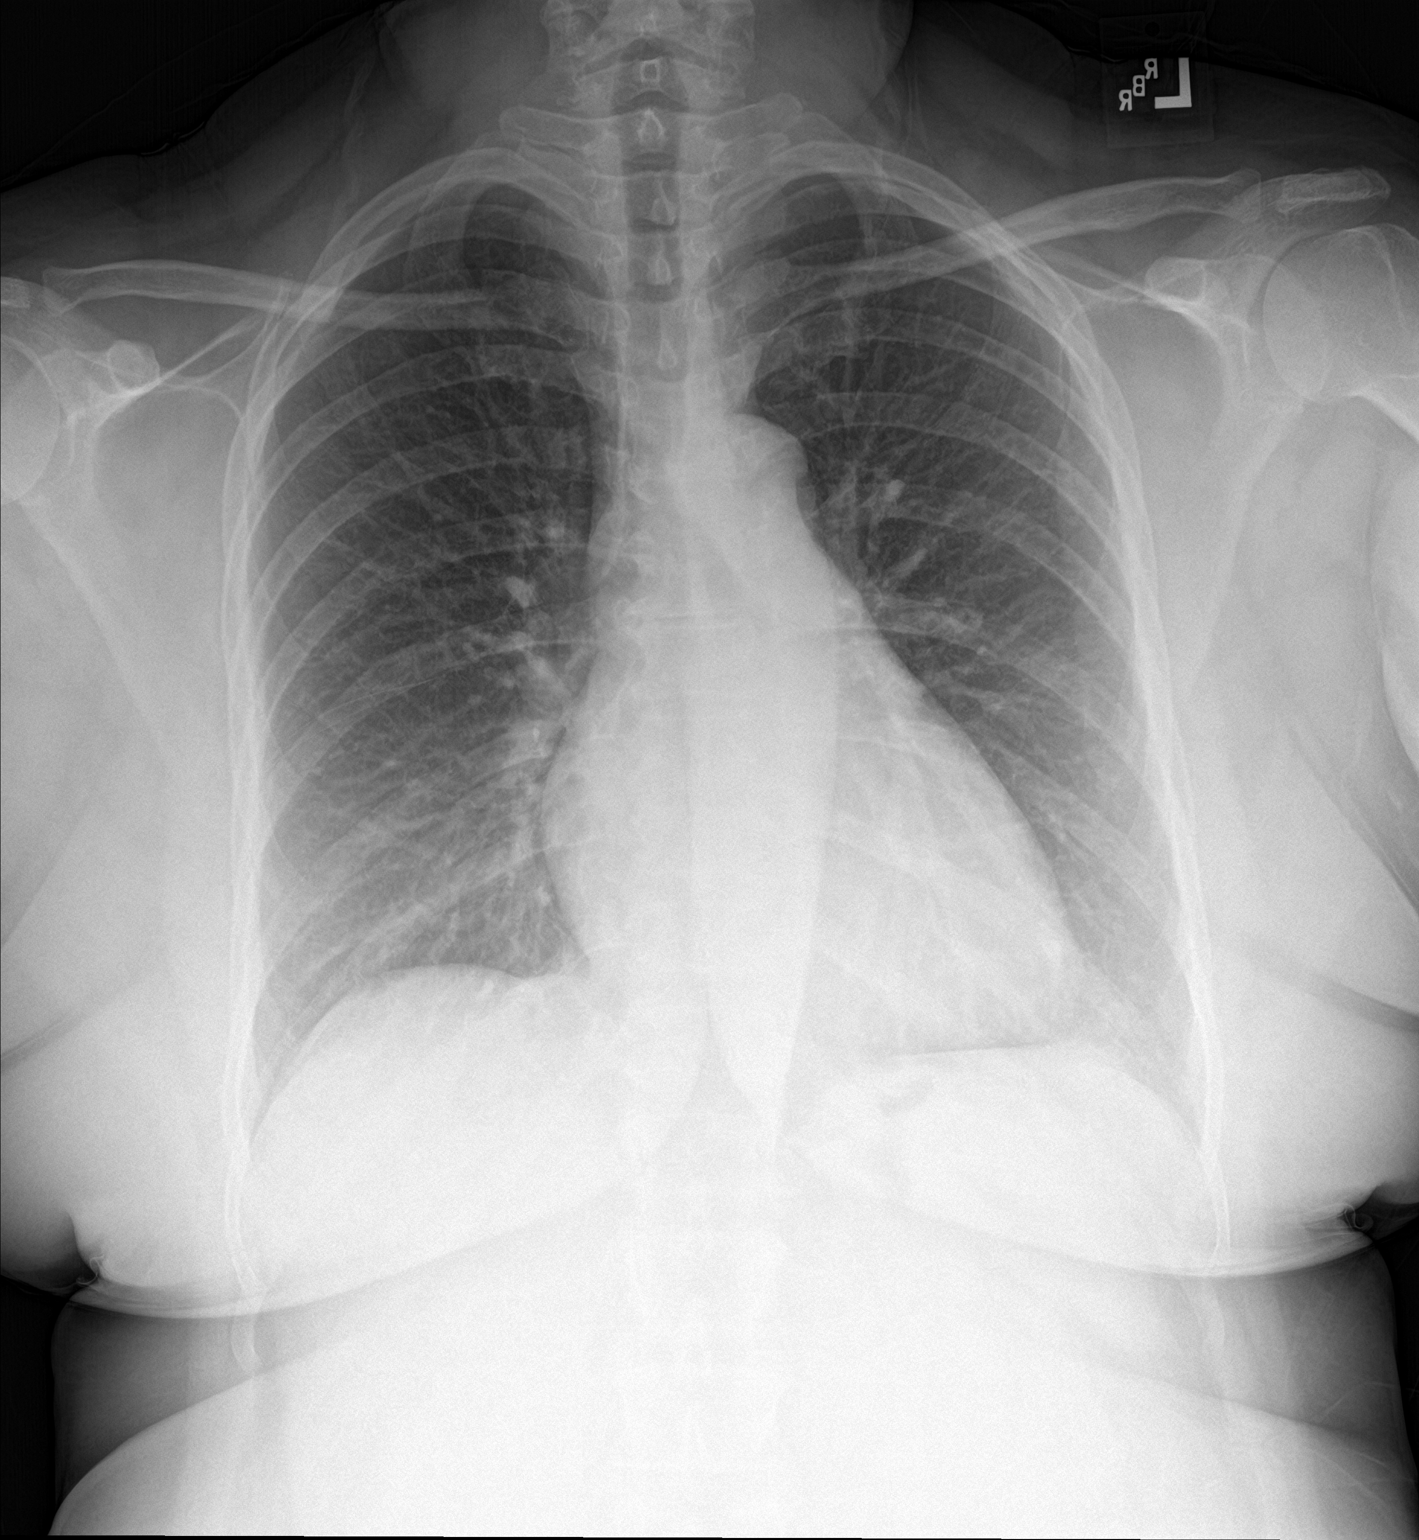

[chest lat]
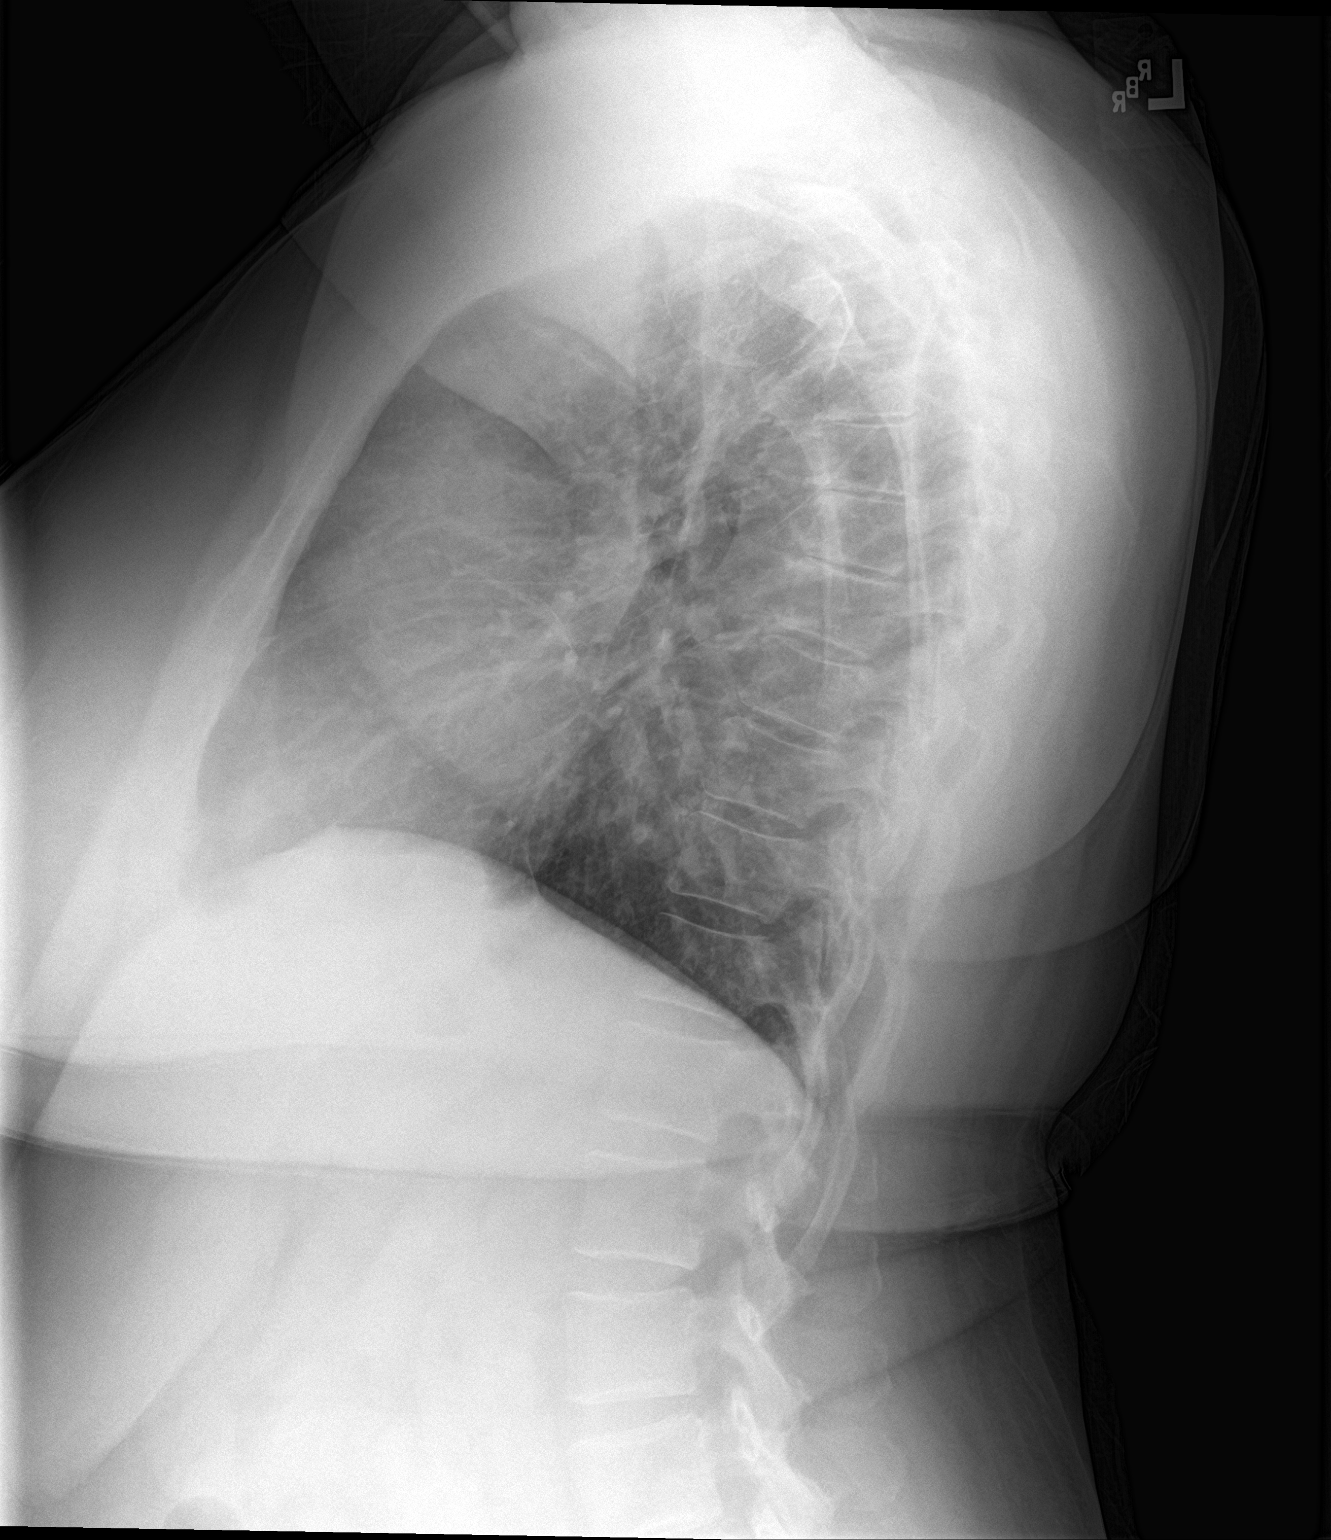

[2 of 2 positions shown; findings below may reference images not displayed]

FINDINGS: The cardiac silhouette, mediastinal and hilar contours are normal
and stable. The lungs are clear. No pleural effusions. The bony
thorax is intact.
IMPRESSION: No acute cardiopulmonary findings.

## 2020-11-25 ENCOUNTER — Encounter (HOSPITAL_BASED_OUTPATIENT_CLINIC_OR_DEPARTMENT_OTHER): Payer: Self-pay | Admitting: Emergency Medicine

## 2020-11-25 ENCOUNTER — Other Ambulatory Visit: Payer: Self-pay

## 2020-11-25 ENCOUNTER — Emergency Department (HOSPITAL_BASED_OUTPATIENT_CLINIC_OR_DEPARTMENT_OTHER)
Admission: EM | Admit: 2020-11-25 | Discharge: 2020-11-25 | Disposition: A | Discharge status: Home / Self Care | Payer: Commercial Managed Care - PPO | Attending: Emergency Medicine | Admitting: Emergency Medicine

## 2020-11-25 DIAGNOSIS — M791 Myalgia, unspecified site: Secondary | ICD-10-CM | POA: Diagnosis not present

## 2020-11-25 DIAGNOSIS — J4530 Mild persistent asthma, uncomplicated: Secondary | ICD-10-CM | POA: Diagnosis not present

## 2020-11-25 DIAGNOSIS — U071 COVID-19: Secondary | ICD-10-CM | POA: Diagnosis not present

## 2020-11-25 DIAGNOSIS — F17210 Nicotine dependence, cigarettes, uncomplicated: Secondary | ICD-10-CM | POA: Diagnosis not present

## 2020-11-25 DIAGNOSIS — R059 Cough, unspecified: Secondary | ICD-10-CM | POA: Diagnosis present

## 2020-11-25 LAB — RESP PANEL BY RT-PCR (FLU A&B, COVID) ARPGX2
Influenza A by PCR: NEGATIVE
Influenza B by PCR: NEGATIVE
SARS Coronavirus 2 by RT PCR: POSITIVE — AB

## 2020-11-25 MED ORDER — ALBUTEROL SULFATE HFA 108 (90 BASE) MCG/ACT IN AERS
2.0000 | INHALATION_SPRAY | Freq: Once | RESPIRATORY_TRACT | Status: AC
Start: 2020-11-25 — End: 2020-11-25
  Administered 2020-11-25: 2 via RESPIRATORY_TRACT
  Filled 2020-11-25: qty 6.7

## 2020-11-25 MED ORDER — ALBUTEROL SULFATE HFA 108 (90 BASE) MCG/ACT IN AERS: 2.0000 | INHALATION_SPRAY | RESPIRATORY_TRACT | 2 refills | Status: AC | PRN

## 2020-11-25 MED ORDER — ACETAMINOPHEN 325 MG PO TABS: 650.0000 mg | ORAL_TABLET | Freq: Once | ORAL | Status: AC

## 2020-11-25 MED ORDER — ONDANSETRON 4 MG PO TBDP: 4.0000 mg | ORAL_TABLET | Freq: Three times a day (TID) | ORAL | 0 refills | Status: DC | PRN

## 2020-11-25 MED ORDER — PREDNISONE 10 MG (21) PO TBPK: ORAL_TABLET | ORAL | 0 refills | Status: DC

## 2020-11-25 MED ORDER — ACETAMINOPHEN 325 MG PO TABS
ORAL_TABLET | ORAL | Status: AC
  Administered 2020-11-25: 650 mg via ORAL
  Filled 2020-11-25: qty 2

## 2020-11-25 NOTE — Discharge Instructions (Addendum)
COVID-19 isolation recommendations  Patients who have symptoms consistent with COVID-19 should self isolate until: At least 3 days (72 hours) have passed since recovery, defined as resolution of fever without the use of fever reducing medications and improvement in respiratory symptoms (e.g., cough, shortness of breath), and At least 7 days have passed since symptoms first appeared. Retesting is not required and not recommended as patients can continue to test positive for several weeks despite lack of symptoms.  COVID-19 Home Management:  You have had a positive test for COVID-19. COVID-19 is caused by a virus. Viruses do not require or respond to antibiotics. Treatment is symptomatic care and it is important to note that these symptoms may last for 7-14 days.   Hand washing: Wash your hands throughout the day, but especially before and after touching the face, using the restroom, sneezing, coughing, or touching surfaces that have been coughed or sneezed upon. Hydration: Symptoms of most illnesses will be intensified and complicated by dehydration. Dehydration can also extend the duration of symptoms. Drink plenty of fluids and get plenty of rest. You should be drinking at least half a liter of water an hour to stay hydrated. Electrolyte drinks (ex. Gatorade, Powerade, Pedialyte) are also encouraged. You should be drinking enough fluids to make your urine light yellow, almost clear. If this is not the case, you are not drinking enough water. Please note that some of the treatments indicated below will not be effective if you are not adequately hydrated. Diet: Please concentrate on hydration, however, you may introduce food slowly.  Start with a clear liquid diet, progressed to a full liquid diet, and then bland solids as you are able. Pain or fever: Ibuprofen, Naproxen, or acetaminophen (generic for Tylenol) for pain or fever.  Antiinflammatory medications: Take 600 mg of ibuprofen every 6 hours or  440 mg (over the counter dose) to 500 mg (prescription dose) of naproxen every 12 hours for the next 3 days. After this time, these medications may be used as needed for pain. Take these medications with food to avoid upset stomach. Choose only one of these medications, do not take them together. Acetaminophen (generic for Tylenol): Should you continue to have additional pain while taking the ibuprofen or naproxen, you may add in acetaminophen as needed. Your daily total maximum amount of acetaminophen from all sources should be limited to 4000mg /day for persons without liver problems, or 2000mg /day for those with liver problems. Nausea/vomiting: Use the ondansetron (generic for Zofran) for nausea or vomiting.  This medication may not prevent all vomiting or nausea, but can help facilitate better hydration. Things that can help with nausea/vomiting also include peppermint/menthol candies, vitamin B12, and ginger. Diarrhea: May use medications such as loperamide (Imodium) or Bismuth subsalicylate (Pepto-Bismol). Cough: Teas, warm liquids, broths, and honey can help with cough. Albuterol: May use the albuterol as needed for instances of shortness of breath. Prednisone: Take the prednisone, as directed, in its entirety. Zyrtec or Claritin: May add these medication daily to control underlying symptoms of congestion, sneezing, and other signs of allergies.  These medications are available over-the-counter. Generics: Cetirizine (generic for Zyrtec) and loratadine (generic for Claritin). Fluticasone: Use fluticasone (generic for Flonase), as directed, for nasal and sinus congestion.  This medication is available over-the-counter. Congestion: Plain guaifenesin (generic for plain Mucinex) may help relieve congestion. Saline sinus rinses and saline nasal sprays may also help relieve congestion.  Sore throat: Warm liquids or Chloraseptic spray may help soothe a sore throat. Gargle twice a  day with a salt water  solution made from a half teaspoon of salt in a cup of warm water.  Follow up: Follow up with a primary care provider within the next two weeks should symptoms fail to resolve. Return: Return to the ED for significantly worsening symptoms, shortness of breath, persistent/worsening chest pain, persistent vomiting, large amounts of blood in stool, worsening/localized abdominal pain, or any other major concerns.  For prescription assistance, may try using prescription discount sites or apps, such as goodrx.com

## 2020-11-25 NOTE — ED Notes (Signed)
ED Provider at bedside. 
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
## 2020-11-25 NOTE — ED Triage Notes (Signed)
Cough, body aches, loss of taste x 4 days.

## 2020-11-26 ENCOUNTER — Telehealth (HOSPITAL_COMMUNITY): Payer: Self-pay | Admitting: Family

## 2020-11-26 DIAGNOSIS — U071 COVID-19: Secondary | ICD-10-CM

## 2020-11-26 NOTE — Telephone Encounter (Signed)
Called to discuss with Staci Righter about Covid symptoms and potential candidacy for the use of sotrovimab, a combination monoclonal antibody infusion for those with mild to moderate Covid symptoms and at a high risk of hospitalization.     Pt is qualified for this infusion at the infusion center due to co-morbid  ( asthma) and/or a member of an at-risk group (elevated SVI), however unable to reach patient. VM left and [facility] sent.    Geoffry Bannister,NP

## 2020-11-26 NOTE — Telephone Encounter (Signed)
Called to discuss with Staci Righter about Covid symptoms and potential candidacy for the use of sotrovimab, a combination monoclonal antibody infusion for those with mild to moderate Covid symptoms and at a high risk of hospitalization.     Pt is qualified for this infusion at the infusion center due to co-morbid conditions and/or a member of an at-risk group, however unable to reach patient. VM left.   Crystal Griffin,NP

## 2020-12-01 ENCOUNTER — Telehealth: Payer: Self-pay | Admitting: Unknown Physician Specialty

## 2020-12-01 NOTE — Telephone Encounter (Signed)
Second call: Called to Discuss with patient about Covid symptoms and the use of the monoclonal antibody infusion for those with mild to moderate Covid symptoms and at a high risk of hospitalization.     Pt appears to qualify for this infusion due to co-morbid conditions and/or a member of an at-risk group in accordance with the FDA Emergency Use Authorization.    Unable to reach pt   LMOM with hotlone.  Mychart sent

## 2021-11-24 ENCOUNTER — Encounter (HOSPITAL_BASED_OUTPATIENT_CLINIC_OR_DEPARTMENT_OTHER): Payer: Self-pay

## 2021-11-24 ENCOUNTER — Emergency Department (HOSPITAL_BASED_OUTPATIENT_CLINIC_OR_DEPARTMENT_OTHER): Payer: Commercial Managed Care - PPO

## 2021-11-24 ENCOUNTER — Emergency Department (HOSPITAL_BASED_OUTPATIENT_CLINIC_OR_DEPARTMENT_OTHER)
Admission: EM | Admit: 2021-11-24 | Discharge: 2021-11-24 | Disposition: A | Discharge status: Home / Self Care | Payer: Commercial Managed Care - PPO | Attending: Emergency Medicine | Admitting: Emergency Medicine

## 2021-11-24 ENCOUNTER — Other Ambulatory Visit: Payer: Self-pay

## 2021-11-24 DIAGNOSIS — R0789 Other chest pain: Secondary | ICD-10-CM

## 2021-11-24 DIAGNOSIS — R072 Precordial pain: Secondary | ICD-10-CM | POA: Insufficient documentation

## 2021-11-24 DIAGNOSIS — F17210 Nicotine dependence, cigarettes, uncomplicated: Secondary | ICD-10-CM | POA: Insufficient documentation

## 2021-11-24 DIAGNOSIS — J45909 Unspecified asthma, uncomplicated: Secondary | ICD-10-CM | POA: Insufficient documentation

## 2021-11-24 DIAGNOSIS — Z96651 Presence of right artificial knee joint: Secondary | ICD-10-CM | POA: Diagnosis not present

## 2021-11-24 DIAGNOSIS — Z79899 Other long term (current) drug therapy: Secondary | ICD-10-CM | POA: Insufficient documentation

## 2021-11-24 LAB — CBC
HCT: 42.5 % (ref 36.0–46.0)
Hemoglobin: 13.8 g/dL (ref 12.0–15.0)
MCH: 29.6 pg (ref 26.0–34.0)
MCHC: 32.5 g/dL (ref 30.0–36.0)
MCV: 91 fL (ref 80.0–100.0)
Platelets: 276 10*3/uL (ref 150–400)
RBC: 4.67 MIL/uL (ref 3.87–5.11)
RDW: 13.9 % (ref 11.5–15.5)
WBC: 4.8 10*3/uL (ref 4.0–10.5)
nRBC: 0 % (ref 0.0–0.2)

## 2021-11-24 LAB — BASIC METABOLIC PANEL
Anion gap: 10 (ref 5–15)
BUN: 12 mg/dL (ref 6–20)
CO2: 22 mmol/L (ref 22–32)
Calcium: 9.9 mg/dL (ref 8.9–10.3)
Chloride: 104 mmol/L (ref 98–111)
Creatinine, Ser: 0.55 mg/dL (ref 0.44–1.00)
GFR, Estimated: >60 mL/min (ref >60)
Glucose, Bld: 159 mg/dL — ABNORMAL HIGH (ref 70–99)
Potassium: 4.2 mmol/L (ref 3.5–5.1)
Sodium: 136 mmol/L (ref 135–145)

## 2021-11-24 LAB — TROPONIN I (HIGH SENSITIVITY): Troponin I (High Sensitivity): <2 ng/L (ref <18)

## 2021-11-24 MED ORDER — TRAMADOL HCL 50 MG PO TABS: 50.0000 mg | ORAL_TABLET | Freq: Four times a day (QID) | ORAL | 0 refills | Status: AC | PRN

## 2021-11-24 MED ORDER — TRAMADOL HCL 50 MG PO TABS
50.0000 mg | ORAL_TABLET | Freq: Once | ORAL | Status: AC
  Administered 2021-11-24: 50 mg via ORAL
  Filled 2021-11-24: qty 1
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
## 2021-11-24 NOTE — ED Triage Notes (Signed)
Pt c/o chest pain x 1 week, has felt lightheaded with this pain. Pt had pain between shoulder blades two days ago, has had shortness of breath.

## 2021-11-24 NOTE — Discharge Instructions (Signed)
Begin taking omeprazole 20 mg twice daily for the next several weeks.  Begin taking tramadol as prescribed as needed for pain.  Follow-up with cardiology in the next few days.  The contact information for the cardiology clinic on [facility] has been provided in this discharge summary for you to call and make these arrangements.  Return to the emergency department if symptoms significantly worsen or change.

## 2022-12-22 ENCOUNTER — Encounter (HOSPITAL_BASED_OUTPATIENT_CLINIC_OR_DEPARTMENT_OTHER): Payer: Self-pay | Admitting: Emergency Medicine

## 2022-12-22 ENCOUNTER — Emergency Department (HOSPITAL_BASED_OUTPATIENT_CLINIC_OR_DEPARTMENT_OTHER)
Admission: EM | Admit: 2022-12-22 | Discharge: 2022-12-22 | Disposition: A | Discharge status: Home / Self Care | Payer: Commercial Managed Care - PPO | Attending: Emergency Medicine | Admitting: Emergency Medicine

## 2022-12-22 ENCOUNTER — Emergency Department (HOSPITAL_BASED_OUTPATIENT_CLINIC_OR_DEPARTMENT_OTHER): Payer: Commercial Managed Care - PPO

## 2022-12-22 ENCOUNTER — Other Ambulatory Visit: Payer: Self-pay

## 2022-12-22 DIAGNOSIS — F17200 Nicotine dependence, unspecified, uncomplicated: Secondary | ICD-10-CM | POA: Diagnosis not present

## 2022-12-22 DIAGNOSIS — Z1152 Encounter for screening for COVID-19: Secondary | ICD-10-CM | POA: Insufficient documentation

## 2022-12-22 DIAGNOSIS — J101 Influenza due to other identified influenza virus with other respiratory manifestations: Secondary | ICD-10-CM | POA: Diagnosis not present

## 2022-12-22 DIAGNOSIS — R059 Cough, unspecified: Secondary | ICD-10-CM | POA: Diagnosis present

## 2022-12-22 HISTORY — DX: Unspecified asthma, uncomplicated: J45.909

## 2022-12-22 LAB — RESP PANEL BY RT-PCR (RSV, FLU A&B, COVID)  RVPGX2
Influenza A by PCR: POSITIVE — AB
Influenza B by PCR: NEGATIVE
Resp Syncytial Virus by PCR: NEGATIVE
SARS Coronavirus 2 by RT PCR: NEGATIVE

## 2022-12-22 MED ORDER — METHYLPREDNISOLONE 4 MG PO TBPK
ORAL_TABLET | ORAL | 0 refills | Status: AC
Start: 2022-12-22 — End: ?

## 2022-12-22 MED ORDER — ACETAMINOPHEN 325 MG PO TABS
650.0000 mg | ORAL_TABLET | Freq: Once | ORAL | Status: AC | PRN
  Administered 2022-12-22: 650 mg via ORAL
  Filled 2022-12-22: qty 2

## 2022-12-22 MED ORDER — GUAIFENESIN 100 MG/5ML PO LIQD: 100.0000 mg | ORAL | 0 refills | Status: AC | PRN

## 2022-12-22 MED ORDER — ONDANSETRON HCL 4 MG PO TABS: 4.0000 mg | ORAL_TABLET | Freq: Three times a day (TID) | ORAL | 0 refills | Status: AC | PRN

## 2022-12-22 MED ORDER — OSELTAMIVIR PHOSPHATE 75 MG PO CAPS
75.0000 mg | ORAL_CAPSULE | Freq: Two times a day (BID) | ORAL | 0 refills | Status: AC
Start: 2022-12-22 — End: ?

## 2022-12-22 MED ORDER — BENZONATATE 100 MG PO CAPS: 100.0000 mg | ORAL_CAPSULE | Freq: Three times a day (TID) | ORAL | 0 refills | Status: AC

## 2022-12-22 NOTE — ED Provider Notes (Signed)
[facility] EMERGENCY DEPARTMENT Provider Note   CSN: [id number] Arrival date & time: 12/22/22  1516     History  Chief Complaint  Patient presents with   Fever   Cough    Heidi Washington is a 56 y.o. female with 2 days of flu like sxs. + fever, cough, chills, body aches headache and fatigue. Took robitussin without relief. No vomiting. She is a daily smoker, She has used her inhaler more frequently.   Fever Associated symptoms: cough   Cough Associated symptoms: fever        Home Medications Prior to Admission medications   Medication Sig Start Date End Date Taking? Authorizing Provider  albuterol (PROVENTIL HFA;VENTOLIN HFA) 108 (90 Base) MCG/ACT inhaler Inhale 2 puffs into the lungs every 4 (four) hours as needed for wheezing or shortness of breath. 11/11/16   Fletcher Anon, MD  albuterol (VENTOLIN HFA) 108 (90 Base) MCG/ACT inhaler Inhale 2 puffs into the lungs every 4 (four) hours as needed for wheezing or shortness of breath. 11/25/20   Joy, Shawn C, PA-C  azithromycin (ZITHROMAX) 250 MG tablet Take 2 tablets today, then 1 tablet daily for 4 days 12/31/16   Fletcher Anon, MD  diphenhydrAMINE (BENADRYL) 25 mg capsule Take 25 mg by mouth every 6 (six) hours as needed.    [provider]  EPINEPHrine (EPIPEN 2-PAK) 0.3 mg/0.3 mL IJ SOAJ injection Use as directed for severe allergic reaction 11/11/16   Fletcher Anon, MD  fexofenadine (ALLEGRA) 180 MG tablet Take 180 mg by mouth daily.    [provider]  fexofenadine (ALLEGRA) 180 MG tablet Take 180 mg by mouth daily.    [provider]  loratadine (CLARITIN) 10 MG tablet Take 10 mg by mouth daily.    [provider]  montelukast (SINGULAIR) 10 MG tablet Take 1 tablet (10 mg total) by mouth at bedtime. 11/11/16   Fletcher Anon, MD  montelukast (SINGULAIR) 10 MG tablet Take 10 mg by mouth at bedtime.    [provider]  ondansetron (ZOFRAN ODT) 4 MG disintegrating  tablet Take 1 tablet (4 mg total) by mouth every 8 (eight) hours as needed for nausea or vomiting. 11/25/20   Joy, Shawn C, PA-C  predniSONE (STERAPRED UNI-PAK 21 TAB) 10 MG (21) TBPK tablet Take 6 tabs (60mg ) day 1, 5 tabs (50mg ) day 2, 4 tabs (40mg ) day 3, 3 tabs (30mg ) day 4, 2 tabs (20mg ) day 5, and 1 tab (10mg ) day 6. 11/25/20   Joy, Shawn C, PA-C  ranitidine (ZANTAC) 150 MG tablet Take 1 tablet (150 mg total) by mouth 2 (two) times daily. 11/11/16   , MD  ranitidine (ZANTAC) 150 MG tablet Take 150 mg by mouth daily.    [provider]  traMADol (ULTRAM) 50 MG tablet Take 1 tablet (50 mg total) by mouth every 6 (six) hours as needed. 11/24/21   , MD      Allergies    Ibuprofen    Review of Systems   Review of Systems  Constitutional:  Positive for fever.  Respiratory:  Positive for cough.     Physical Exam Updated Vital Signs BP (!) 132/115 (BP Location: Right Arm)   Pulse (!) 107   Temp (!) 102.5 F (39.2 C) (Oral)   Resp 20   Ht 5\' 5"  (1.651 m)   Wt 83.5 kg   SpO2 99%   BMI 30.62 kg/m  Physical Exam Vitals and nursing note  reviewed.  Constitutional:      General: She is not in acute distress.    Appearance: She is well-developed. She is ill-appearing. She is not diaphoretic.  HENT:     Head: Normocephalic and atraumatic.     Right Ear: External ear normal.     Left Ear: External ear normal.     Nose: Nose normal.     Mouth/Throat:     Mouth: Mucous membranes are moist.  Eyes:     General: No scleral icterus.    Conjunctiva/sclera: Conjunctivae normal.  Cardiovascular:     Rate and Rhythm: Normal rate and regular rhythm.     Heart sounds: Normal heart sounds. No murmur heard.    No friction rub. No gallop.  Pulmonary:     Effort: Pulmonary effort is normal. No respiratory distress.     Breath sounds: Wheezing (mild exp wheeze) present.  Abdominal:     General: Bowel sounds are normal. There is no distension.     Palpations:  Abdomen is soft. There is no mass.     Tenderness: There is no abdominal tenderness. There is no guarding.  Musculoskeletal:     Cervical back: Normal range of motion.  Skin:    General: Skin is warm and dry.  Neurological:     Mental Status: She is alert and oriented to person, place, and time.  Psychiatric:        Behavior: Behavior normal.     ED Results / Procedures / Treatments   Labs (all labs ordered are listed, but only abnormal results are displayed) Labs Reviewed  RESP PANEL BY RT-PCR (RSV, FLU A&B, COVID)  [id number] - Abnormal; Notable for the following components:      Result Value   Influenza A by PCR POSITIVE (*)    All other components within normal limits    EKG None  Radiology DG Chest 2 View  Result Date: 12/22/2022 CLINICAL DATA:  Cough, fever EXAM: CHEST - 2 VIEW COMPARISON:  11/24/2021 FINDINGS: Cardiac size is within normal limits. Lung fields are clear of any infiltrates or pulmonary edema. Small transverse linear density in left mid lung field has not changed suggesting scarring. There is no pleural effusion or pneumothorax. IMPRESSION: No active cardiopulmonary disease. Electronically Signed   By: Ernie Avena M.D.   On: 12/22/2022 15:52    Procedures Procedures    Medications Ordered in ED Medications  acetaminophen (TYLENOL) tablet 650 mg (650 mg Oral Given 12/22/22 1540)    ED Course/ Medical Decision Making/ A&P Clinical Course as of 12/22/22 2137  Sun Dec 22, 2022  1612 DG Chest 2 View [AH]    Clinical Course User Index [AH] Arthor Captain, PA-C                           Medical Decision Making Patient with symptoms consistent with influenza.  Vitals are stable, fever resolved with tylenol. No signs of dehydration, tolerating PO's.  . Due to patient's presentation and physical exam a chest x-ray was not ordered bc likely diagnosis of flu.  Discussed the cost versus benefit of Tamiflu treatment with the patient.  The patient  understands that symptoms are greater than the recommended 24-48 hour window of treatment.  Patient will be discharged with instructions to orally hydrate, rest, and use over-the-counter medications such as anti-inflammatories ibuprofen and Aleve for muscle aches and Tylenol for fever.  Patient will also be given a cough suppressant.  Amount and/or Complexity of Data Reviewed Radiology: ordered. Decision-making details documented in ED Course.  Risk OTC drugs.           Final Clinical Impression(s) / ED Diagnoses Final diagnoses:  None    Rx / DC Orders ED Discharge Orders     None         Arthor Captain, PA-C 12/22/22 2151    Mesner, Barbara Cower, MD 12/24/22 [phone]

## 2022-12-22 NOTE — ED Triage Notes (Signed)
Pt arrives pov, steady gait, c/o cough congestion and fever x 2 days.

## 2022-12-22 NOTE — Discharge Instructions (Signed)
